# Patient Record
Sex: Male | Born: 1960
Health system: Southern US, Community
[De-identification: ages and names within clinical notes are randomized; demographics above are authoritative.]

## PROBLEM LIST (undated history)

## (undated) DIAGNOSIS — M1990 Unspecified osteoarthritis, unspecified site: Secondary | ICD-10-CM

## (undated) DIAGNOSIS — I10 Essential (primary) hypertension: Secondary | ICD-10-CM

## (undated) HISTORY — PX: OTHER SURGICAL HISTORY: SHX169

---

## 1998-07-04 ENCOUNTER — Encounter: Payer: Self-pay | Admitting: Emergency Medicine

## 1998-07-04 ENCOUNTER — Emergency Department (HOSPITAL_COMMUNITY): Admission: EM | Admit: 1998-07-04 | Discharge: 1998-07-04 | Payer: Self-pay | Admitting: Emergency Medicine

## 1998-07-11 ENCOUNTER — Emergency Department (HOSPITAL_COMMUNITY): Admission: EM | Admit: 1998-07-11 | Discharge: 1998-07-11 | Payer: Self-pay | Admitting: Emergency Medicine

## 2004-11-27 ENCOUNTER — Emergency Department (HOSPITAL_COMMUNITY): Admission: EM | Admit: 2004-11-27 | Discharge: 2004-11-27 | Payer: Self-pay | Admitting: Emergency Medicine

## 2005-03-02 ENCOUNTER — Emergency Department (HOSPITAL_COMMUNITY): Admission: EM | Admit: 2005-03-02 | Discharge: 2005-03-02 | Payer: Self-pay | Admitting: Emergency Medicine

## 2005-04-06 ENCOUNTER — Emergency Department (HOSPITAL_COMMUNITY): Admission: EM | Admit: 2005-04-06 | Discharge: 2005-04-06 | Payer: Self-pay | Admitting: Emergency Medicine

## 2007-02-22 ENCOUNTER — Ambulatory Visit (HOSPITAL_COMMUNITY): Admission: RE | Admit: 2007-02-22 | Discharge: 2007-02-22 | Payer: Self-pay | Admitting: Specialist

## 2008-02-23 ENCOUNTER — Ambulatory Visit (HOSPITAL_BASED_OUTPATIENT_CLINIC_OR_DEPARTMENT_OTHER): Admission: RE | Admit: 2008-02-23 | Discharge: 2008-02-23 | Payer: Self-pay | Admitting: Orthopedic Surgery

## 2008-07-04 ENCOUNTER — Ambulatory Visit (HOSPITAL_BASED_OUTPATIENT_CLINIC_OR_DEPARTMENT_OTHER): Admission: RE | Admit: 2008-07-04 | Discharge: 2008-07-04 | Payer: Self-pay | Admitting: Orthopedic Surgery

## 2008-10-06 ENCOUNTER — Emergency Department (HOSPITAL_COMMUNITY): Admission: EM | Admit: 2008-10-06 | Discharge: 2008-10-06 | Payer: Self-pay | Admitting: Emergency Medicine

## 2009-04-13 IMAGING — CR DG LUMBAR SPINE COMPLETE 4+V
5 series · 5 of 5 positions shown · non-contrast
Comparison: None.

CLINICAL DATA: 47-year-old male back pain, recent injury 5 days ago

LUMBAR SPINE - COMPLETE 4+ VIEW

[t l-spine a.p.]
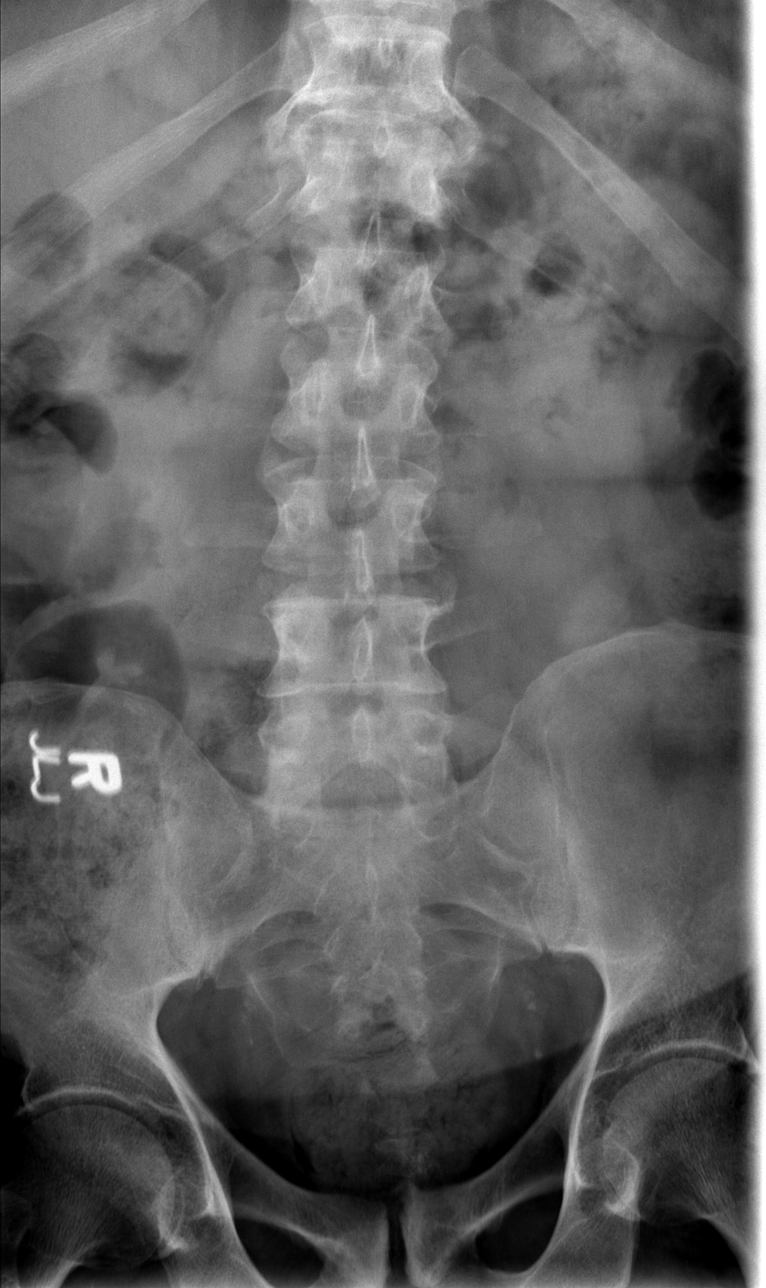

[t l-spine oblique exposure (1 of 2)]
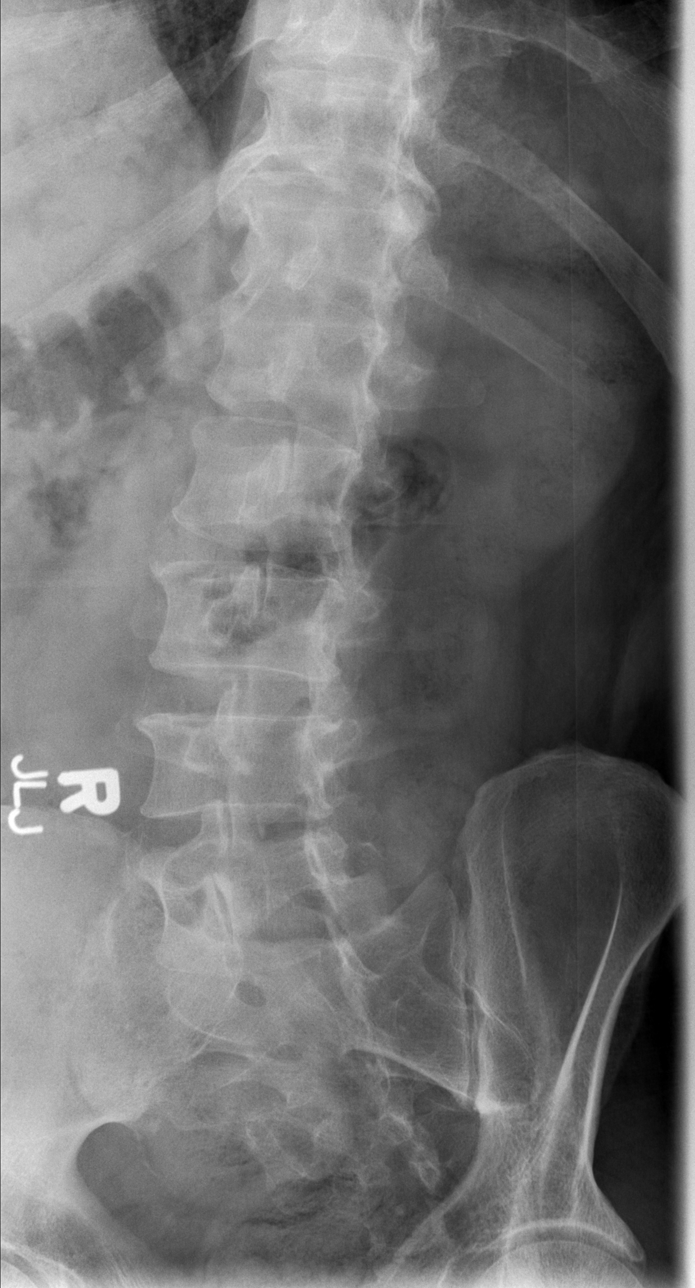

[t l-spine oblique exposure (2 of 2)]
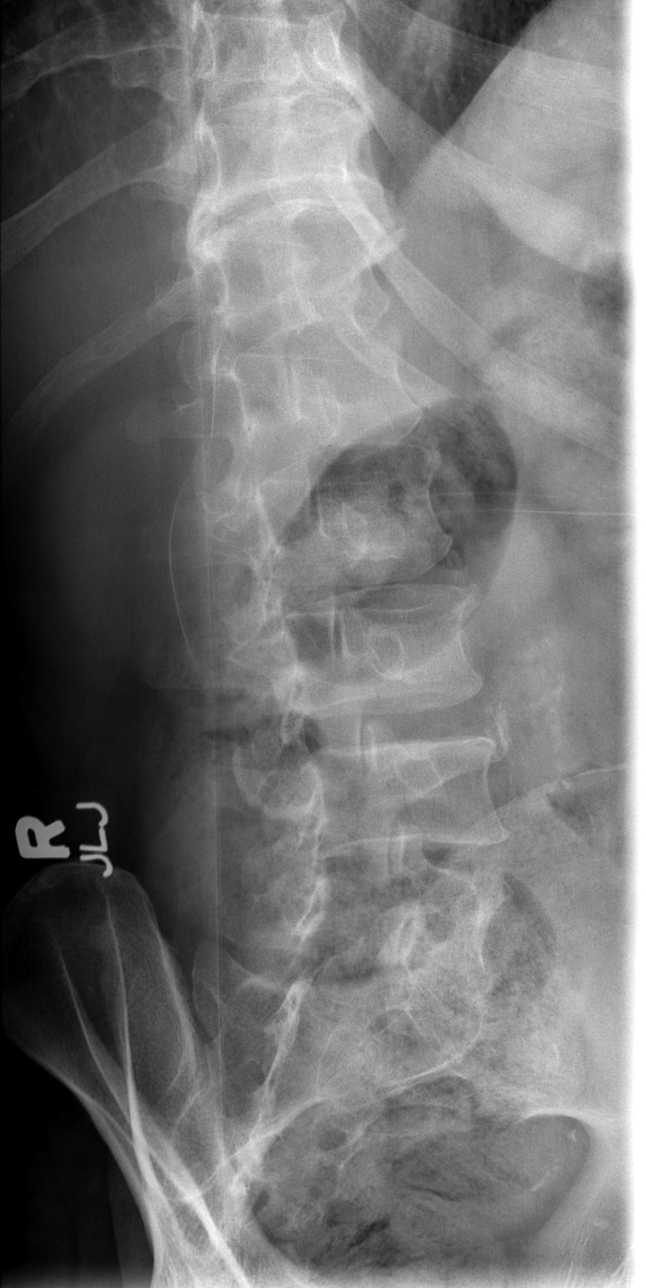

[t l-spine lat]
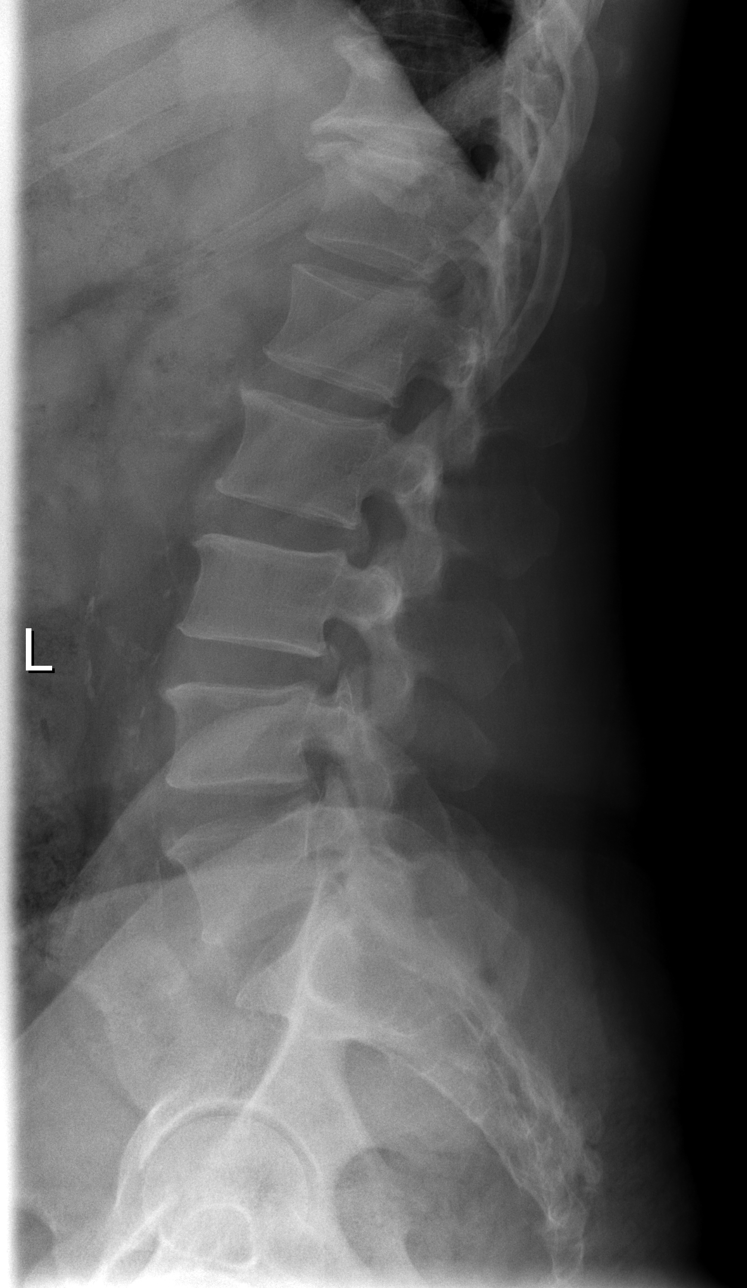

[t l-spine l5-s1 spot]
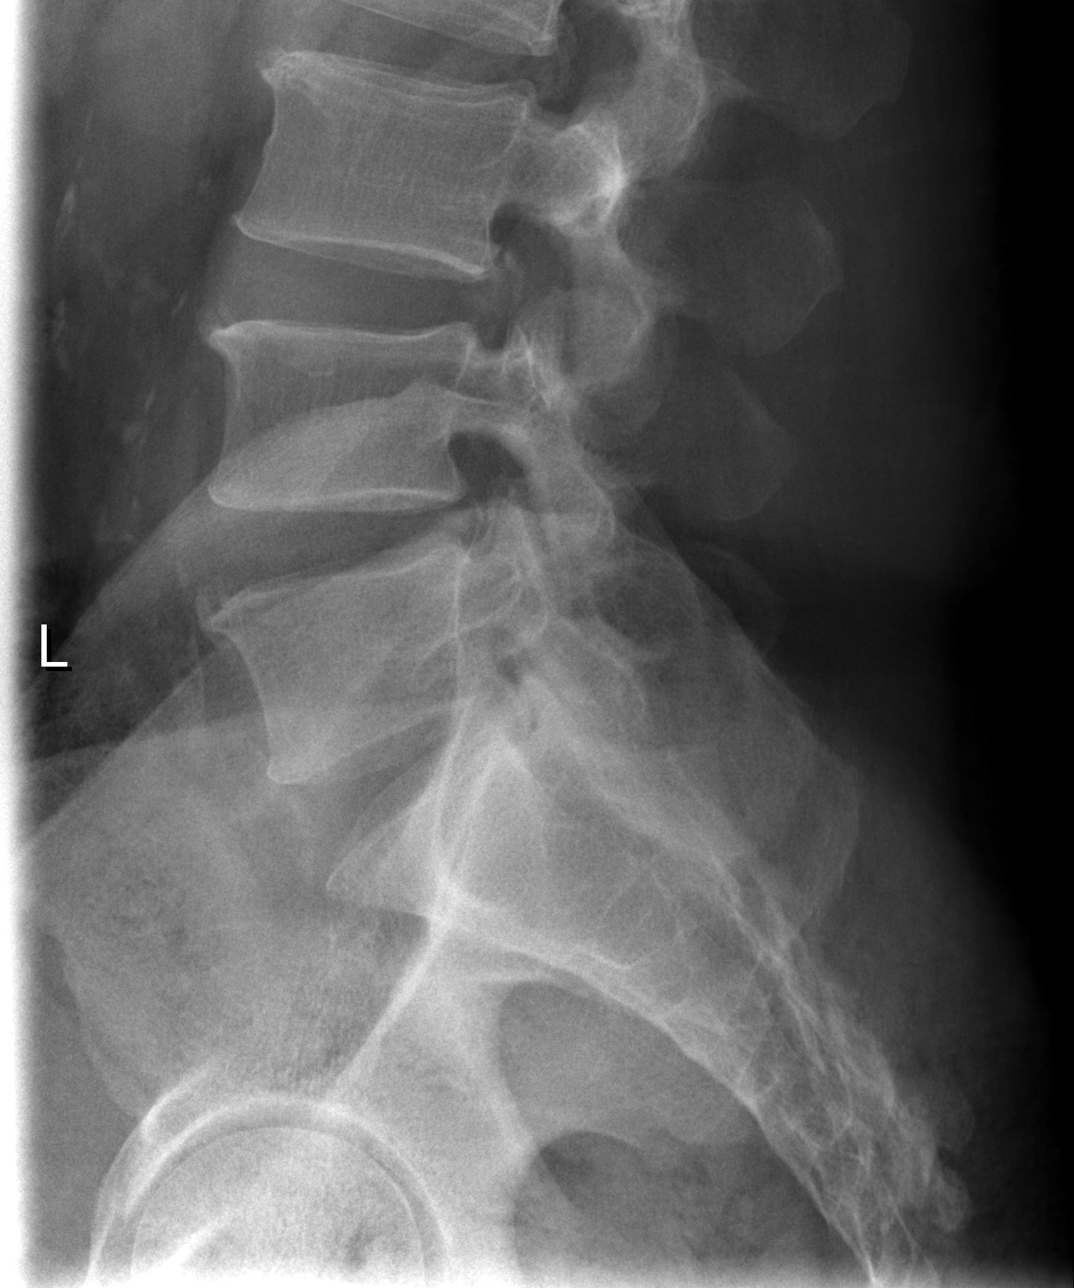

[5 of 5 positions shown; findings below may reference images not displayed]

FINDINGS: Normal lumbar spine alignment.  Minimal endplate bony
spurring of the lumbar spine anteriorly.  More advanced
degenerative disc disease and spondylosis of the lower thoracic
spine at T11-12.  No compression fracture, focal kyphosis, or wedge-
shaped deformity.  Atherosclerosis of the aorta anteriorly.  No
pars defects.  Intact pedicles.  Normal SI joints.
IMPRESSION: Mild degenerative changes and spondylosis.  No acute finding by
plain radiography.

## 2010-11-12 ENCOUNTER — Encounter (INDEPENDENT_AMBULATORY_CARE_PROVIDER_SITE_OTHER): Payer: Self-pay | Admitting: *Deleted

## 2010-11-13 ENCOUNTER — Encounter (INDEPENDENT_AMBULATORY_CARE_PROVIDER_SITE_OTHER): Payer: Self-pay | Admitting: *Deleted

## 2010-11-14 ENCOUNTER — Ambulatory Visit
Admission: RE | Admit: 2010-11-14 | Discharge: 2010-11-14 | Payer: Self-pay | Source: Home / Self Care | Attending: Gastroenterology | Admitting: Gastroenterology

## 2010-11-27 ENCOUNTER — Ambulatory Visit
Admission: RE | Admit: 2010-11-27 | Discharge: 2010-11-27 | Payer: Self-pay | Source: Home / Self Care | Attending: Gastroenterology | Admitting: Gastroenterology

## 2010-11-27 ENCOUNTER — Encounter: Payer: Self-pay | Admitting: Gastroenterology

## 2010-11-27 ENCOUNTER — Other Ambulatory Visit: Payer: Self-pay | Admitting: Gastroenterology

## 2010-12-03 ENCOUNTER — Encounter: Payer: Self-pay | Admitting: Gastroenterology

## 2010-12-11 ENCOUNTER — Emergency Department (HOSPITAL_COMMUNITY): Payer: Worker's Compensation

## 2010-12-11 ENCOUNTER — Inpatient Hospital Stay (HOSPITAL_COMMUNITY)
Admission: EM | Admit: 2010-12-11 | Discharge: 2010-12-13 | DRG: 494 | Disposition: A | Discharge status: Home / Self Care | Payer: Worker's Compensation | Attending: Orthopaedic Surgery | Admitting: Orthopaedic Surgery

## 2010-12-11 ENCOUNTER — Emergency Department (HOSPITAL_COMMUNITY): Payer: Self-pay

## 2010-12-11 ENCOUNTER — Encounter (HOSPITAL_COMMUNITY): Payer: Self-pay | Admitting: Radiology

## 2010-12-11 DIAGNOSIS — W11XXXA Fall on and from ladder, initial encounter: Secondary | ICD-10-CM | POA: Diagnosis present

## 2010-12-11 DIAGNOSIS — S42413A Displaced simple supracondylar fracture without intercondylar fracture of unspecified humerus, initial encounter for closed fracture: Principal | ICD-10-CM | POA: Diagnosis present

## 2010-12-11 DIAGNOSIS — Y998 Other external cause status: Secondary | ICD-10-CM

## 2010-12-11 DIAGNOSIS — F17200 Nicotine dependence, unspecified, uncomplicated: Secondary | ICD-10-CM | POA: Diagnosis present

## 2010-12-11 LAB — GLUCOSE, CAPILLARY: Glucose-Capillary: 225 mg/dL — ABNORMAL HIGH (ref 70–99)

## 2010-12-12 ENCOUNTER — Inpatient Hospital Stay (HOSPITAL_COMMUNITY): Payer: Worker's Compensation

## 2010-12-12 LAB — GLUCOSE, CAPILLARY

## 2010-12-12 NOTE — Letter (Signed)
Summary: Pre Visit Letter Revised  [facility]  [address] [place], [place] [address]   Phone: [phone]  Fax: [phone]        11/12/2010 MRN: [id number] [facility] [address] [place], [place]  [address]             Procedure Date:  November 27, 2010   Welcome to the Gastroenterology Division at [facility].    You are scheduled to see a nurse for your pre-procedure visit on November 14, 2010 at 4:30pm on the 3rd floor at [facility], [address]  We ask that you try to arrive at our office 15 minutes prior to your appointment time to allow for check-in.  Please take a minute to review the attached form.  If you answer "Yes" to one or more of the questions on the first page, we ask that you call the person listed at your earliest opportunity.  If you answer "No" to all of the questions, please complete the rest of the form and bring it to your appointment.    Your nurse visit will consist of discussing your medical and surgical history, your immediate family medical history, and your medications.   If you are unable to list all of your medications on the form, please bring the medication bottles to your appointment and we will list them.  We will need to be aware of both prescribed and over the counter drugs.  We will need to know exact dosage information as well.    Please be prepared to read and sign documents such as consent forms, a financial agreement, and acknowledgement forms.  If necessary, and with your consent, a friend or relative is welcome to sit-in on the nurse visit with you.  Please bring your insurance card so that we may make a copy of it.  If your insurance requires a referral to see a specialist, please bring your referral form from your primary care physician.  No co-pay is required for this nurse visit.     If you cannot keep your appointment, please call [phone] to cancel or reschedule prior to your  appointment date.  This allows [place] the opportunity to schedule an appointment for another patient in need of care.    Thank you for choosing [facility] for your medical needs.  We appreciate the opportunity to care for you.  Please visit [place] at our website  to learn more about our practice.  Sincerely, The Gastroenterology Division

## 2010-12-12 NOTE — Procedures (Addendum)
Summary: Colonoscopy  Patient: Travis White Note: All result statuses are Final unless otherwise noted.  Tests: (1) Colonoscopy (COL)   COL Colonoscopy           DONE     [facility]     [address]     [place], [place]  [address]           COLONOSCOPY PROCEDURE REPORT           PATIENT:  [name], [name]  MR#:  [id number]     BIRTHDATE:  1961/03/01, 49 yrs. old  GENDER:  male     ENDOSCOPIST:  Vania Rea. Jarold Motto, MD, [facility]     REF. BY:  Jarome Matin, M.D.     PROCEDURE DATE:  11/27/2010     PROCEDURE:  Colonoscopy with snare polypectomy     ASA CLASS:  Class II     INDICATIONS:  Routine Risk Screening     MEDICATIONS:   Fentanyl 75 mcg IV, Versed 7 mg           DESCRIPTION OF PROCEDURE:   After the risks benefits and     alternatives of the procedure were thoroughly explained, informed     consent was obtained.  Digital rectal exam was performed and     revealed no abnormalities.   The [id number] endoscope was     introduced through the anus and advanced to the cecum, which was     identified by both the appendix and ileocecal valve, without     limitations.  The quality of the prep was excellent, using     MoviPrep.  The instrument was then slowly withdrawn as the colon     was fully examined.     <<PROCEDUREIMAGES>>           FINDINGS:  ULTRASONIC FINDINGS:  A sessile polyp was found at the     hepatic flexure. -3 CM. FLAT SESSILE FLESHY POLYP PIECEMEAL     REMOVED.  There were multiple polyps identified and removed. in     the rectum. FLAT 1-2 MM POLYPS COLD SNARE REMOVED.   Retroflexed     views in the rectum revealed external hemorrhoids.    The scope     was then withdrawn from the patient and the procedure completed.           COMPLICATIONS:  None     ENDOSCOPIC IMPRESSION:     1) Sessile polyp at the hepatic flexure     2) Polyps, multiple in the rectum     3) External hemorrhoids     MULTIPLE ADENOMAS.R/O CA.     RECOMMENDATIONS:     1) Repeat  Colonoscopy in 3 years.     REPEAT EXAM:  No           ______________________________     Vania Rea. Jarold Motto, MD, [facility]           CC:  Jarome Matin, M.D.           n.     eSIGNED:   Vania Rea. Patterson at 11/27/2010 11:55 AM           Beaulah Dinning, [id number]  Note: An exclamation mark (!) indicates a result that was not dispersed into the flowsheet. Document Creation Date: 11/27/2010 11:55 AM _______________________________________________________________________  (1) Order result status: Final Collection or observation date-time: 11/27/2010 11:46 Requested date-time:  Receipt date-time:  Reported date-time:  Referring Physician:   Ordering Physician: Onalee Hua  Jarold Motto [phone]) Specimen Source:  Source: Launa Grill Order Number: [id number] Lab site:   Appended Document: Colonoscopy     Procedures Next Due Date:    Colonoscopy: 11/2013

## 2010-12-12 NOTE — Letter (Signed)
Summary: Patient Notice- Polyp Results  [facility]  [address] [place], [place] [address]   Phone: [phone]  Fax: [phone]        December 03, 2010 MRN: [id number]    Travis White [address] [place], [place]  [address]    Dear [name],  I am pleased to inform you that the colon polyp(s) removed during your recent colonoscopy was (were) found to be benign (no cancer detected) upon pathologic examination.  I recommend you have a repeat colonoscopy examination in 3_ years to look for recurrent polyps, as having colon polyps increases your risk for having recurrent polyps or even colon cancer in the future.  Should you develop new or worsening symptoms of abdominal pain, bowel habit changes or bleeding from the rectum or bowels, please schedule an evaluation with either your primary care physician or with me.  Additional information/recommendations:  _x_ No further action with gastroenterology is needed at this time. Please      follow-up with your primary care physician for your other healthcare      needs.  __ Please call [phone] to schedule a return visit to review your      situation.  __ Please keep your follow-up visit as already scheduled.  __ Continue treatment plan as outlined the day of your exam.  Please call us if you are having persistent problems or have questions about your condition that have not been fully answered at this time.  Sincerely,  Mardella Layman MD [facility]  This letter has been electronically signed by your physician.  Appended Document: Patient Notice- Polyp Results Letter mailed

## 2010-12-12 NOTE — Letter (Signed)
Summary: [facility] Instructions  [facility]  [address] [place], [place] [address]   Phone: [phone]  Fax: [phone]       HABEEB PUERTAS    January 11, 1961    MRN: [id number]        Procedure Day Dorna Bloom:  Wednesday 11/27/2010     Arrival Time:  9:30 am     Procedure Time: 10:30 am     Location of Procedure:                    _ x_  [facility] (4th Floor)                        PREPARATION FOR COLONOSCOPY WITH MOVIPREP   Starting 5 days prior to your procedure Friday 1/13 do not eat nuts, seeds, popcorn, corn, beans, peas,  salads, or any raw vegetables.  Do not take any fiber supplements (e.g. Metamucil, Citrucel, and Benefiber).  THE DAY BEFORE YOUR PROCEDURE         DATE: Tuesday 1/17  1.  Drink clear liquids the entire day-NO SOLID FOOD  2.  Do not drink anything colored red or purple.  Avoid juices with pulp.  No orange juice.  3.  Drink at least 64 oz. (8 glasses) of fluid/clear liquids during the day to prevent dehydration and help the prep work efficiently.  CLEAR LIQUIDS INCLUDE: Water Jello Ice Popsicles Tea (sugar ok, no milk/cream) Powdered fruit flavored drinks Coffee (sugar ok, no milk/cream) Gatorade Juice: apple, white grape, white cranberry  Lemonade Clear bullion, consomm, broth Carbonated beverages (any kind) Strained chicken noodle soup Hard Candy                             4.  In the morning, mix first dose of MoviPrep solution:    Empty 1 Pouch A and 1 Pouch B into the disposable container    Add lukewarm drinking water to the top line of the container. Mix to dissolve    Refrigerate (mixed solution should be used within 24 hrs)  5.  Begin drinking the prep at 5:00 p.m. The MoviPrep container is divided by 4 marks.   Every 15 minutes drink the solution down to the next mark (approximately 8 oz) until the full liter is complete.   6.  Follow completed prep with 16 oz of clear liquid of your choice  (Nothing red or purple).  Continue to drink clear liquids until bedtime.  7.  Before going to bed, mix second dose of MoviPrep solution:    Empty 1 Pouch A and 1 Pouch B into the disposable container    Add lukewarm drinking water to the top line of the container. Mix to dissolve    Refrigerate  THE DAY OF YOUR PROCEDURE      DATE: Wednesday 1/18  Beginning at 5:30 a.m. (5 hours before procedure):         1. Every 15 minutes, drink the solution down to the next mark (approx 8 oz) until the full liter is complete.  2. Follow completed prep with 16 oz. of clear liquid of your choice.    3. You may drink clear liquids until 8:30 am (2 HOURS BEFORE PROCEDURE).   MEDICATION INSTRUCTIONS  Unless otherwise instructed, you should take regular prescription medications with a small sip of water   as early as possible the morning of  your procedure.        OTHER INSTRUCTIONS  You will need a responsible adult at least 51 years of age to accompany you and drive you home.   This person must remain in the waiting room during your procedure.  Wear loose fitting clothing that is easily removed.  Leave jewelry and other valuables at home.  However, you may wish to bring a book to read or  an iPod/MP3 player to listen to music as you wait for your procedure to start.  Remove all body piercing jewelry and leave at home.  Total time from sign-in until discharge is approximately 2-3 hours.  You should go home directly after your procedure and rest.  You can resume normal activities the  day after your procedure.  The day of your procedure you should not:   Drive   Make legal decisions   Operate machinery   Drink alcohol   Return to work  You will receive specific instructions about eating, activities and medications before you leave.    The above instructions have been reviewed and explained to me by   Karl Bales RN  November 14, 2010 4:37 PM    I fully understand  and can verbalize these instructions _____________________________ Date _________

## 2010-12-12 NOTE — Miscellaneous (Signed)
Summary: Lidocaine 3% - Hydrocortisone 1% Cream Kit  Clinical Lists Changes  Medications: Removed medication of MOVIPREP 100 GM  SOLR (PEG-KCL-NACL-NASULF-NA ASC-C) As per prep instructions. Added new medication of LIDOCAINE-HYDROCORTISONE ACE 3-1 % KIT (LIDOCAINE-HYDROCORTISONE ACE) apply to rectum two times a day - Signed Rx of LIDOCAINE-HYDROCORTISONE ACE 3-1 % KIT (LIDOCAINE-HYDROCORTISONE ACE) apply to rectum two times a day;  #1 tube x 1;  Signed;  Entered by: Harlow Mares CMA (AAMA);  Authorized by: Mardella Layman MD [facility];  Method used: Electronically to [facility] [phone]*, [address] [place], [place]  [address], Ph: [phone], Fax: [phone]    Prescriptions: LIDOCAINE-HYDROCORTISONE ACE 3-1 % KIT (LIDOCAINE-HYDROCORTISONE ACE) apply to rectum two times a day  #1 tube x 1   Entered by:   Harlow Mares CMA (AAMA)   Authorized by:   Mardella Layman MD [facility]   Signed by:   Harlow Mares CMA (AAMA) on 11/27/2010   Method used:   Electronically to        [facility] [phone]* (retail)       [address]       [place], [place]  [address]       Ph: [phone]       Fax: [phone]   RxID:   [id number]

## 2010-12-12 NOTE — Miscellaneous (Signed)
Summary: [facility] previsit  Clinical Lists Changes  Medications: Added new medication of MOVIPREP 100 GM  SOLR (PEG-KCL-NACL-NASULF-NA ASC-C) As per prep instructions. - Signed Rx of MOVIPREP 100 GM  SOLR (PEG-KCL-NACL-NASULF-NA ASC-C) As per prep instructions.;  #1 x 0;  Signed;  Entered by: Karl Bales RN;  Authorized by: Mardella Layman MD [facility];  Method used: Electronically to [facility] [phone]*, [address] [place], [place]  [address], Ph: [phone], Fax: [phone] Observations: Added new observation of NKA: T (11/14/2010 16:19)    Prescriptions: MOVIPREP 100 GM  SOLR (PEG-KCL-NACL-NASULF-NA ASC-C) As per prep instructions.  #1 x 0   Entered by:   Karl Bales RN   Authorized by:   Mardella Layman MD [facility]   Signed by:   Karl Bales RN on 11/14/2010   Method used:   Electronically to        [facility] [phone]* (retail)       [address]       [place], [place]  [address]       Ph: [phone]       Fax: [phone]   RxID:   [id number]

## 2010-12-13 LAB — HEMOGLOBIN AND HEMATOCRIT, BLOOD: HCT: 33.2 % — ABNORMAL LOW (ref 39.0–52.0)

## 2010-12-13 LAB — GLUCOSE, CAPILLARY: Glucose-Capillary: 166 mg/dL — ABNORMAL HIGH (ref 70–99)

## 2010-12-24 NOTE — Op Note (Signed)
NAME:  Travis White, Travis White               ACCOUNT NO.:  [id number]  MEDICAL RECORD NO.:  [id number]           PATIENT TYPE:  I  LOCATION:  5012                         FACILITY:  [facility]  PHYSICIAN:  Kou Gucciardo C. Ophelia White, M.D.    DATE OF BIRTH:  01/28/1961  DATE OF PROCEDURE:  12/11/2010 DATE OF DISCHARGE:                              OPERATIVE REPORT   PREOPERATIVE DIAGNOSIS:  Left comminuted supracondylar fracture with intercondylar extension.  POSTOPERATIVE DIAGNOSIS:  Left comminuted supracondylar fracture with intercondylar extension.  PROCEDURE:  ORIF left supracondylar humerus fracture with intercondylar extension, olecranon osteotomy.  SURGEON:  Annell Greening, MD  ANESTHESIA:  General.  TOURNIQUET TIME:  An hour and 35 minutes.  COMPONENTS:  DePuy posterolateral plate and medial plate.  Cannulated screw and 18-gauge wire for olecranon osteotomy fixation.  SURGEON:  Annell Greening, MD  ASSISTANTRanae Palms, RNFA  ANESTHESIA:  GOT plus preoperative interscalene block.  BRIEF HISTORY:  This 50 year old male was on the job on the ladder.  The ladder gave way or collapsed and he fell with the ladder lying on the floor on outstretched left nondominant arm with the supracondylar humerus fracture, intercondylar extension with comminution in the intercondylar region.  He was neurovascularly intact and taken to the OR for emergent fixation.  He had been n.p.o. since early a.m.  After induction of general anesthesia orotracheal intubation, the patient was placed prone on chest rolls with careful padding and positioning, foam pads over the arm, 10/15 drape.  Prepping and draping from the wrist to the shoulder was performed, impervious stockinette, Coban and extremity sheets and drapes, sterile tourniquet, sterile skin marker, Betadine Steri-Strips.  Surgical time-out procedure was completed.  Arm was wrapped in Esmarch, tourniquet inflated, 2 grams Ancef began prophylactically.  Time-out was  completed.  Posterior incision was made.  Soft tissue was developed on the triceps, right and left.  A cannulated pin was placed up the olecranon followed by over drilling and then a 6.2 K-wire placed in the ulna appropriate position just slightly and then oscillating saw was used to perform the osteotomy in the mid position of olecranon at oblique angle and cracking it with a 1-inch osteotome.  It was distracted.  There was difficulty finding the ulnar nerve.  About 20 minutes were spent trying to find the ulnar nerve but it was not in the groove and the 2 distal fragments of the humerus were widespread by several centimeters with rotation.  Continued dissection developing the olecranon osteotomy, lifting the triceps, and eventually the ulnar nerve was finally found proximally closer to the inner muscular septum falling back distally and vessel loop was placed for preservation of infection.  Posterior lateral plate was placed first after initial K-wire fixation with cross pins, transverse distal pin. Combination of locking, nonlocking screws were used for stabilization, some of it were slightly angled, were nonlocking used with washer.  This was the DePuy posterolateral anatomic humerus plate, titanium.  Medial plate was used on the medial side for reduction.  There were some comminution in the fossa with some small fragments and a fingertip could be placed through the middle  of the fossa anteriorly.  This actually allowed for visualization and then making sure that the home run screws are in good position and did not come anteriorly because fingertip could be introduced anteriorly through the awl for palpation.  Proximal screws were placed bicortically.  Some of the screws on medial plate ran into the screws from the posterior lateral plate and had to be angled with washers to make sure there were bicortical tightened down securely.  Several screws were passed from the medial lateral  and lateral medial and home run screw was placed from the posterior lateral side arm piece across which was 56-60-mm.  Spot pictures were taken confirming excellent position alignment of all screws, good correction of the anterior angulation.  I was able to flex and extend with no crepitus.  Olecranon osteotomy was repaired, washer was placed on the screw, small drill made in figure-of-eight wire that was passed, tightened down over the handle of a Cobb with square knot tight and cut, tips turned down, irrigation again.  Ulnar nerve was in good position, was not transposed.  Triceps was repaired on each side.  Care was taken not to damage the ulnar nerve with the sutures.  Two on the subcutaneous tissue and skin staple closure postop dressing and long-arm splint. Instrument and needle count was correct.  Time-out closure procedure was completed.  There was no specimen.  Few of the tiny 2-3 mm pieces of bone were discarded from the fossa.     Travis White, M.D.     MCY/MEDQ  D:  12/11/2010  T:  12/12/2010  Job:  [id number]  Electronically Signed by Annell Greening M.D. on 12/24/2010 04:58:03 PM

## 2011-01-02 ENCOUNTER — Ambulatory Visit: Payer: Worker's Compensation | Attending: Orthopaedic Surgery | Admitting: Rehabilitation

## 2011-01-02 DIAGNOSIS — M2550 Pain in unspecified joint: Secondary | ICD-10-CM | POA: Insufficient documentation

## 2011-01-02 DIAGNOSIS — M2560 Stiffness of unspecified joint, not elsewhere classified: Secondary | ICD-10-CM | POA: Insufficient documentation

## 2011-01-02 DIAGNOSIS — IMO0001 Reserved for inherently not codable concepts without codable children: Secondary | ICD-10-CM | POA: Insufficient documentation

## 2011-01-06 ENCOUNTER — Ambulatory Visit: Payer: Worker's Compensation | Admitting: Physical Therapy

## 2011-01-07 ENCOUNTER — Ambulatory Visit: Payer: Worker's Compensation | Admitting: Physical Therapy

## 2011-01-09 ENCOUNTER — Ambulatory Visit: Payer: Worker's Compensation | Attending: Orthopaedic Surgery | Admitting: Rehabilitation

## 2011-01-09 DIAGNOSIS — M2550 Pain in unspecified joint: Secondary | ICD-10-CM | POA: Insufficient documentation

## 2011-01-09 DIAGNOSIS — M2560 Stiffness of unspecified joint, not elsewhere classified: Secondary | ICD-10-CM | POA: Insufficient documentation

## 2011-01-09 DIAGNOSIS — IMO0001 Reserved for inherently not codable concepts without codable children: Secondary | ICD-10-CM | POA: Insufficient documentation

## 2011-01-14 ENCOUNTER — Ambulatory Visit: Payer: Worker's Compensation | Admitting: Rehabilitation

## 2011-01-15 ENCOUNTER — Ambulatory Visit: Payer: Worker's Compensation | Admitting: Rehabilitation

## 2011-01-16 ENCOUNTER — Ambulatory Visit: Payer: Worker's Compensation | Admitting: Physical Therapy

## 2011-01-20 ENCOUNTER — Ambulatory Visit: Payer: Worker's Compensation | Admitting: Physical Therapy

## 2011-01-22 ENCOUNTER — Ambulatory Visit: Payer: Worker's Compensation | Admitting: Physical Therapy

## 2011-01-23 ENCOUNTER — Ambulatory Visit: Payer: Worker's Compensation | Admitting: Physical Therapy

## 2011-01-27 ENCOUNTER — Ambulatory Visit: Payer: Worker's Compensation | Admitting: Physical Therapy

## 2011-01-29 ENCOUNTER — Ambulatory Visit: Payer: Worker's Compensation | Admitting: Physical Therapy

## 2011-01-30 ENCOUNTER — Ambulatory Visit: Payer: Worker's Compensation | Admitting: Physical Therapy

## 2011-02-03 ENCOUNTER — Ambulatory Visit: Payer: Worker's Compensation | Admitting: Physical Therapy

## 2011-02-05 ENCOUNTER — Ambulatory Visit: Payer: Worker's Compensation | Admitting: Physical Therapy

## 2011-02-06 ENCOUNTER — Ambulatory Visit: Payer: Worker's Compensation | Admitting: Rehabilitation

## 2011-02-10 ENCOUNTER — Ambulatory Visit: Payer: Worker's Compensation | Attending: Orthopaedic Surgery | Admitting: Physical Therapy

## 2011-02-10 DIAGNOSIS — IMO0001 Reserved for inherently not codable concepts without codable children: Secondary | ICD-10-CM | POA: Insufficient documentation

## 2011-02-10 DIAGNOSIS — M2550 Pain in unspecified joint: Secondary | ICD-10-CM | POA: Insufficient documentation

## 2011-02-10 DIAGNOSIS — M2560 Stiffness of unspecified joint, not elsewhere classified: Secondary | ICD-10-CM | POA: Insufficient documentation

## 2011-02-12 ENCOUNTER — Ambulatory Visit: Payer: Worker's Compensation | Admitting: Physical Therapy

## 2011-02-13 ENCOUNTER — Ambulatory Visit: Payer: Worker's Compensation | Admitting: Rehabilitation

## 2011-02-18 ENCOUNTER — Ambulatory Visit: Payer: Worker's Compensation | Admitting: Rehabilitation

## 2011-02-19 ENCOUNTER — Ambulatory Visit: Payer: Worker's Compensation | Admitting: Physical Therapy

## 2011-02-20 ENCOUNTER — Ambulatory Visit: Payer: Worker's Compensation | Admitting: Rehabilitation

## 2011-02-25 ENCOUNTER — Ambulatory Visit: Payer: Worker's Compensation | Admitting: Physical Therapy

## 2011-02-26 ENCOUNTER — Ambulatory Visit: Payer: Worker's Compensation | Admitting: Rehabilitation

## 2011-02-27 ENCOUNTER — Ambulatory Visit: Payer: Worker's Compensation | Admitting: Physical Therapy

## 2011-03-04 ENCOUNTER — Ambulatory Visit: Payer: Worker's Compensation | Admitting: Physical Therapy

## 2011-03-05 ENCOUNTER — Ambulatory Visit: Payer: Worker's Compensation | Admitting: Physical Therapy

## 2011-03-06 ENCOUNTER — Ambulatory Visit: Payer: Worker's Compensation | Admitting: Physical Therapy

## 2011-03-10 ENCOUNTER — Ambulatory Visit: Payer: Worker's Compensation | Admitting: Physical Therapy

## 2011-03-12 ENCOUNTER — Ambulatory Visit: Payer: Worker's Compensation | Attending: Rehabilitation | Admitting: Rehabilitation

## 2011-03-12 DIAGNOSIS — M2550 Pain in unspecified joint: Secondary | ICD-10-CM | POA: Insufficient documentation

## 2011-03-12 DIAGNOSIS — IMO0001 Reserved for inherently not codable concepts without codable children: Secondary | ICD-10-CM | POA: Insufficient documentation

## 2011-03-12 DIAGNOSIS — M2560 Stiffness of unspecified joint, not elsewhere classified: Secondary | ICD-10-CM | POA: Insufficient documentation

## 2011-03-13 ENCOUNTER — Ambulatory Visit: Payer: Worker's Compensation | Admitting: Rehabilitation

## 2011-03-17 ENCOUNTER — Ambulatory Visit: Payer: Worker's Compensation | Admitting: Physical Therapy

## 2011-03-19 ENCOUNTER — Ambulatory Visit: Payer: Worker's Compensation | Admitting: Physical Therapy

## 2011-03-20 ENCOUNTER — Ambulatory Visit: Payer: Worker's Compensation | Admitting: Physical Therapy

## 2011-03-25 NOTE — Op Note (Signed)
NAME:  Travis White, Travis White               ACCOUNT NO.:  [id number]   MEDICAL RECORD NO.:  [id number]          PATIENT TYPE:  AMB   LOCATION:  DSC                          FACILITY:  [facility]   PHYSICIAN:  Cindee Salt, M.D.       DATE OF BIRTH:  26-Jan-1961   DATE OF PROCEDURE:  02/23/2008  DATE OF DISCHARGE:                               OPERATIVE REPORT   PREOPERATIVE DIAGNOSIS:  Carpal tunnel syndrome, left hand.   POSTOPERATIVE DIAGNOSIS:  Carpal tunnel syndrome, left hand.   OPERATION:  Decompression of left median nerve.   SURGEON:  Cindee Salt, M.D.   ASSISTANT:  __________   ANESTHESIA:  Forearm based IV regional.   ANESTHESIOLOGIST:  Burna Forts, M.D.   HISTORY:  The patient is a 50 year old male with a history of carpal  tunnel syndrome, EMG nerve conductions positive, not responsive to  conservative treatment.  He has elected to undergo decompression.  Pre,  peri, and postoperative course are known to him.  He is aware of risks  and complications including infection, recurrence, injury to arteries,  nerves, and tendons, incomplete relief of symptoms, and dystrophy.  He  has elect to proceed to have procedure done. Questions have been  encouraged and answered in the preoperative area.  The patient was seen,  the extremity marked by both the patient and surgeon.  Antibiotic given.   DESCRIPTION OF PROCEDURE:  The patient was brought to the operating room  where forearm based IV regional anesthetic was carried out under the  direction of Dr. Jacklynn Bue.  He was prepped using DuraPrep, supine  position, left arm free.  After a time-out was performed, a longitudinal  incision was made in the palm and carried down through subcutaneous  tissue.  Bleeders were electrocauterized.  Palmar fascia was split,  superficial palmar arch identified, and flexor tendon to the ring little  finger identified to the ulnar side of median nerve.  The carpal  retinaculum was incised with sharp  dissection.  Right angle and Sewall  retractor were placed between skin and forearm fascia.  The fascia was  released for approximately 1.5 cm proximal to the wrist crease under  direct vision.  The canal was explored.  Area compression to the nerve  was immediately apparent with an hourglass deformity and significant  hyperemia.  The wound was irrigated.  Skin closed with interrupted 5-0  Vicryl Rapide sutures.  Sterile compressive dressing and splint to the  wrist was applied with finger left free.  The patient tolerated the  procedure well and was taken to the recovery room for observation in  satisfactory condition.  He will be discharged to home __________  [facility] in 1 week on Vicodin.          ______________________________  Cindee Salt, M.D.    GK/MEDQ  D:  02/23/2008  T:  02/24/2008  Job:  [id number]

## 2011-03-25 NOTE — Op Note (Signed)
NAME:  Travis White, Travis White               ACCOUNT NO.:  [id number]   MEDICAL RECORD NO.:  [id number]          PATIENT TYPE:  AMB   LOCATION:  DSC                          FACILITY:  [facility]   PHYSICIAN:  Cindee Salt, M.D.       DATE OF BIRTH:  July 27, 1961   DATE OF PROCEDURE:  07/04/2008  DATE OF DISCHARGE:                               OPERATIVE REPORT   PREOPERATIVE DIAGNOSIS:  Carpal tunnel syndrome, right hand.   POSTOPERATIVE DIAGNOSIS:  Carpal tunnel syndrome, right hand.   OPERATION:  Decompression right median nerve.   SURGEON:  Cindee Salt, MD   ASSISTANT:  Joaquin Courts, RN   ANESTHESIA:  Forearm based IV regional.   ANESTHESIOLOGIST:  Janetta Hora. Gelene Mink, MD   HISTORY:  The patient is a 50 year old male with a history of carpal  tunnel syndrome.  EMG nerve conductions positive which has not responded  to conservative treatment.  He has elected to proceed to have this  surgically released.  He is aware of risks and complications including  infection, recurrence injury to arteries, nerves, or tendons, incomplete  relief of symptoms, and dystrophy.  Preoperative area, the patient is  seen.  The extremity marked by both the patient and surgeon.  Antibiotic  given.   PROCEDURE:  The patient was brought to the operating room where forearm  based IV regional anesthetic was carried out without difficulty.  He was  prepped using DuraPrep, supine position, right arm free.  A time-out was  taken.  A longitudinal incision was made in the palm carried down  through the subcutaneous tissue.  Bleeders were electrocauterized.  Palmar fascia was split.  Superficial palmar arch identified.  The  flexor tendon to the ring and little finger identified.  To the ulnar  side of the median nerve, the carpal retinaculum was incised with a  sharp dissection.  Right angle and Sewall retractor were placed between  skin and forearm fascia.  The fascia was released for approximately 1.5  cm proximal to the  wrist crease under direct vision.  Canal was  explored.  Area of compression to the nerve was apparent.  No further  lesions were identified.  The wound was irrigated.  Skin was then closed  with interrupted 5-0 Vicryl Rapide sutures.  A sterile compressive  dressing splint to the wrist with fingers free was applied.  The patient  tolerated the procedure well and was taken to the recovery room for  observation in satisfactory condition.  He will be discharged to home to  return to [facility] of [place] in 1 week on Vicodin.           ______________________________  Cindee Salt, M.D.    GK/MEDQ  D:  07/04/2008  T:  07/05/2008  Job:  [id number]

## 2011-08-05 LAB — POCT HEMOGLOBIN-HEMACUE: Hemoglobin: 15.2

## 2011-11-14 ENCOUNTER — Other Ambulatory Visit: Payer: Self-pay | Admitting: Internal Medicine

## 2011-11-14 DIAGNOSIS — M542 Cervicalgia: Secondary | ICD-10-CM

## 2011-11-17 ENCOUNTER — Other Ambulatory Visit: Payer: Self-pay | Admitting: Internal Medicine

## 2011-11-17 DIAGNOSIS — Z139 Encounter for screening, unspecified: Secondary | ICD-10-CM

## 2011-11-19 ENCOUNTER — Ambulatory Visit
Admission: RE | Admit: 2011-11-19 | Discharge: 2011-11-19 | Disposition: A | Discharge status: Home / Self Care | Payer: Self-pay | Source: Ambulatory Visit | Attending: Internal Medicine | Admitting: Internal Medicine

## 2011-11-19 DIAGNOSIS — M542 Cervicalgia: Secondary | ICD-10-CM

## 2011-11-19 DIAGNOSIS — Z139 Encounter for screening, unspecified: Secondary | ICD-10-CM

## 2013-09-23 ENCOUNTER — Encounter: Payer: Self-pay | Admitting: Gastroenterology

## 2014-04-11 ENCOUNTER — Encounter: Payer: Self-pay | Admitting: Gastroenterology

## 2014-11-04 ENCOUNTER — Emergency Department (HOSPITAL_COMMUNITY)
Admission: EM | Admit: 2014-11-04 | Discharge: 2014-11-04 | Disposition: A | Discharge status: Home / Self Care | Payer: BC Managed Care – PPO | Source: Home / Self Care | Attending: Family Medicine | Admitting: Family Medicine

## 2014-11-04 ENCOUNTER — Ambulatory Visit (HOSPITAL_COMMUNITY): Payer: BC Managed Care – PPO | Attending: Family Medicine

## 2014-11-04 ENCOUNTER — Encounter (HOSPITAL_COMMUNITY): Payer: Self-pay | Admitting: *Deleted

## 2014-11-04 DIAGNOSIS — M25569 Pain in unspecified knee: Secondary | ICD-10-CM

## 2014-11-04 DIAGNOSIS — M25561 Pain in right knee: Secondary | ICD-10-CM | POA: Diagnosis present

## 2014-11-04 DIAGNOSIS — W010XXA Fall on same level from slipping, tripping and stumbling without subsequent striking against object, initial encounter: Secondary | ICD-10-CM | POA: Diagnosis not present

## 2014-11-04 DIAGNOSIS — M179 Osteoarthritis of knee, unspecified: Secondary | ICD-10-CM | POA: Insufficient documentation

## 2014-11-04 DIAGNOSIS — M11261 Other chondrocalcinosis, right knee: Secondary | ICD-10-CM | POA: Diagnosis not present

## 2014-11-04 DIAGNOSIS — S8991XA Unspecified injury of right lower leg, initial encounter: Secondary | ICD-10-CM | POA: Insufficient documentation

## 2014-11-04 DIAGNOSIS — M25559 Pain in unspecified hip: Secondary | ICD-10-CM

## 2014-11-04 DIAGNOSIS — M25551 Pain in right hip: Secondary | ICD-10-CM | POA: Insufficient documentation

## 2014-11-04 MED ORDER — DICLOFENAC SODIUM 50 MG PO TBEC: 50.0000 mg | DELAYED_RELEASE_TABLET | Freq: Two times a day (BID) | ORAL | Status: DC | PRN

## 2014-11-04 MED ORDER — HYDROCODONE-ACETAMINOPHEN 5-325 MG PO TABS: 1.0000 | ORAL_TABLET | Freq: Four times a day (QID) | ORAL | Status: DC | PRN

## 2014-11-04 NOTE — ED Provider Notes (Signed)
Travis White is a 53 y.o. male who presents to Urgent Care today for hip pain and knee pain. Patient slipped at home and his right leg was forcefully abducted at the hip. He notes continued groin pain as well as new onset knee pain over the last several days. He thinks he is limping from his hip and that is causing his knee pain. The pain is worse with activity better with rest. He's tried some over-the-counter medications which helped. Additionally he's tried some tramadol which did not help much.   Past Medical History  Diagnosis Date  . Diabetes mellitus    Past Surgical History  Procedure Laterality Date  . Left elbow     History  Substance Use Topics  . Smoking status: Current Every Day Smoker -- 1.50 packs/day    Types: Cigarettes  . Smokeless tobacco: Not on file  . Alcohol Use: No   ROS as above Medications: No current facility-administered medications for this encounter.   Current Outpatient Prescriptions  Medication Sig Dispense Refill  . insulin glargine (LANTUS) 100 UNIT/ML injection Inject 20 Units into the skin daily.    . metFORMIN (GLUCOPHAGE) 1000 MG tablet Take 1,000 mg by mouth 2 (two) times daily with a meal.    . diclofenac (VOLTAREN) 50 MG EC tablet Take 1 tablet (50 mg total) by mouth 2 (two) times daily as needed. 30 tablet 0  . HYDROcodone-acetaminophen (NORCO/VICODIN) 5-325 MG per tablet Take 1 tablet by mouth every 6 (six) hours as needed. 20 tablet 0   No Known Allergies   Exam:  BP 125/76 mmHg  Pulse 95  Temp(Src) 98.1 F (36.7 C) (Oral)  Resp 18  SpO2 99% Gen: Well NAD Right hip normal-appearing mildly tender at the greater trochanter. Pain with range of motion. Right knee normal-appearing no significant effusion. Diffusely tender. Significant pain with range of motion. Range of motions limited from 10-100. Stable ligamentous exam.  No results found for this or any previous visit (from the past 24 hour(s)). Dg Hip Complete  Right  11/04/2014   CLINICAL DATA:  Right hip pain post injury 5 days ago  EXAM: RIGHT HIP - COMPLETE 2+ VIEW  COMPARISON:  None.  FINDINGS: Three views of the right hip submitted. No acute fracture or subluxation. Bilateral hip joints are symmetrical in appearance.  IMPRESSION: Negative.   Electronically Signed   By: Natasha MeadLiviu  Pop M.D.   On: 11/04/2014 15:18   Dg Knee Complete 4 Views Right  11/04/2014   CLINICAL DATA:  Slipped and injury to the right leg. Pain in the right knee and hip.  EXAM: RIGHT KNEE - COMPLETE 4+ VIEW  COMPARISON:  None.  FINDINGS: Negative for fracture or dislocation. Bilateral chondrocalcinosis in the knee joint. Mild joint space narrowing with osteophytes along the medial knee compartment. Degenerative changes in patellofemoral compartment. Difficult to exclude a small suprapatellar joint effusion.  IMPRESSION: Mild osteoarthritis in the right knee with diffuse chondrocalcinosis.  No acute bone abnormality.   Electronically Signed   By: Richarda OverlieAdam  Henn M.D.   On: 11/04/2014 15:20    Assessment and Plan: 53 y.o. male with right knee and hip pain status post fall. No fractures. Plan to treat with diclofenac and Norco. Follow-up with PCP.  Discussed warning signs or symptoms. Please see discharge instructions. Patient expresses understanding.     Rodolph BongEvan S Sequoyah Counterman, MD 11/04/14 [phone]

## 2014-11-04 NOTE — Discharge Instructions (Signed)
Thank you for coming in today. Follow-up with orthopedics. Use crutches as needed   Arthralgia Your caregiver has diagnosed you as suffering from an arthralgia. Arthralgia means there is pain in a joint. This can come from many reasons including:  Bruising the joint which causes soreness (inflammation) in the joint.  Wear and tear on the joints which occur as we grow older (osteoarthritis).  Overusing the joint.  Various forms of arthritis.  Infections of the joint. Regardless of the cause of pain in your joint, most of these different pains respond to anti-inflammatory drugs and rest. The exception to this is when a joint is infected, and these cases are treated with antibiotics, if it is a bacterial infection. HOME CARE INSTRUCTIONS   Rest the injured area for as long as directed by your caregiver. Then slowly start using the joint as directed by your caregiver and as the pain allows. Crutches as directed may be useful if the ankles, knees or hips are involved. If the knee was splinted or casted, continue use and care as directed. If an stretchy or elastic wrapping bandage has been applied today, it should be removed and re-applied every 3 to 4 hours. It should not be applied tightly, but firmly enough to keep swelling down. Watch toes and feet for swelling, bluish discoloration, coldness, numbness or excessive pain. If any of these problems (symptoms) occur, remove the ace bandage and re-apply more loosely. If these symptoms persist, contact your caregiver or return to this location.  For the first 24 hours, keep the injured extremity elevated on pillows while lying down.  Apply ice for 15-20 minutes to the sore joint every couple hours while awake for the first half day. Then 03-04 times per day for the first 48 hours. Put the ice in a plastic bag and place a towel between the bag of ice and your skin.  Wear any splinting, casting, elastic bandage applications, or slings as  instructed.  Only take over-the-counter or prescription medicines for pain, discomfort, or fever as directed by your caregiver. Do not use aspirin immediately after the injury unless instructed by your physician. Aspirin can cause increased bleeding and bruising of the tissues.  If you were given crutches, continue to use them as instructed and do not resume weight bearing on the sore joint until instructed. Persistent pain and inability to use the sore joint as directed for more than 2 to 3 days are warning signs indicating that you should see a caregiver for a follow-up visit as soon as possible. Initially, a hairline fracture (break in bone) may not be evident on X-rays. Persistent pain and swelling indicate that further evaluation, non-weight bearing or use of the joint (use of crutches or slings as instructed), or further X-rays are indicated. X-rays may sometimes not show a small fracture until a week or 10 days later. Make a follow-up appointment with your own caregiver or one to whom we have referred you. A radiologist (specialist in reading X-rays) may read your X-rays. Make sure you know how you are to obtain your X-ray results. Do not assume everything is normal if you do not hear from us. SEEK MEDICAL CARE IF: Bruising, swelling, or pain increases. SEEK IMMEDIATE MEDICAL CARE IF:   Your fingers or toes are numb or blue.  The pain is not responding to medications and continues to stay the same or get worse.  The pain in your joint becomes severe.  You develop a fever over 102 F (38.9  C).  It becomes impossible to move or use the joint. MAKE SURE YOU:   Understand these instructions.  Will watch your condition.  Will get help right away if you are not doing well or get worse. Document Released: 10/27/2005 Document Revised: 01/19/2012 Document Reviewed: 06/14/2008 [facility] Patient Information 2015 [place], [place]. This information is not intended to replace advice given to you  by your health care provider. Make sure you discuss any questions you have with your health care provider.

## 2014-11-04 NOTE — ED Notes (Signed)
Early Monday morning stepped on toy, landing on left knee with "right leg going straight out to the side".  C/O continued right groin pain.  Called PCP - was given tramadol - has been taking every 6 hrs without relief, along with IBU.  Has some right knee pain now "from compensating".

## 2016-05-19 DIAGNOSIS — M109 Gout, unspecified: Secondary | ICD-10-CM | POA: Diagnosis not present

## 2016-05-19 DIAGNOSIS — Z720 Tobacco use: Secondary | ICD-10-CM | POA: Diagnosis not present

## 2016-05-19 DIAGNOSIS — E1165 Type 2 diabetes mellitus with hyperglycemia: Secondary | ICD-10-CM | POA: Diagnosis not present

## 2016-05-19 DIAGNOSIS — I10 Essential (primary) hypertension: Secondary | ICD-10-CM | POA: Diagnosis not present

## 2016-09-04 DIAGNOSIS — M109 Gout, unspecified: Secondary | ICD-10-CM | POA: Diagnosis not present

## 2016-09-04 DIAGNOSIS — L409 Psoriasis, unspecified: Secondary | ICD-10-CM | POA: Diagnosis not present

## 2016-09-04 DIAGNOSIS — I10 Essential (primary) hypertension: Secondary | ICD-10-CM | POA: Diagnosis not present

## 2016-09-04 DIAGNOSIS — Z1389 Encounter for screening for other disorder: Secondary | ICD-10-CM | POA: Diagnosis not present

## 2016-09-04 DIAGNOSIS — E1165 Type 2 diabetes mellitus with hyperglycemia: Secondary | ICD-10-CM | POA: Diagnosis not present

## 2016-09-23 DIAGNOSIS — M79645 Pain in left finger(s): Secondary | ICD-10-CM | POA: Diagnosis not present

## 2016-09-23 DIAGNOSIS — Z6835 Body mass index (BMI) 35.0-35.9, adult: Secondary | ICD-10-CM | POA: Diagnosis not present

## 2016-12-01 DIAGNOSIS — Z6834 Body mass index (BMI) 34.0-34.9, adult: Secondary | ICD-10-CM | POA: Diagnosis not present

## 2016-12-01 DIAGNOSIS — M25561 Pain in right knee: Secondary | ICD-10-CM | POA: Diagnosis not present

## 2016-12-01 DIAGNOSIS — M109 Gout, unspecified: Secondary | ICD-10-CM | POA: Diagnosis not present

## 2016-12-01 DIAGNOSIS — I10 Essential (primary) hypertension: Secondary | ICD-10-CM | POA: Diagnosis not present

## 2016-12-04 DIAGNOSIS — M109 Gout, unspecified: Secondary | ICD-10-CM | POA: Diagnosis not present

## 2016-12-04 DIAGNOSIS — I10 Essential (primary) hypertension: Secondary | ICD-10-CM | POA: Diagnosis not present

## 2016-12-04 DIAGNOSIS — E1165 Type 2 diabetes mellitus with hyperglycemia: Secondary | ICD-10-CM | POA: Diagnosis not present

## 2016-12-04 DIAGNOSIS — M25561 Pain in right knee: Secondary | ICD-10-CM | POA: Diagnosis not present

## 2017-03-11 ENCOUNTER — Ambulatory Visit (INDEPENDENT_AMBULATORY_CARE_PROVIDER_SITE_OTHER): Payer: BLUE CROSS/BLUE SHIELD

## 2017-03-11 ENCOUNTER — Ambulatory Visit (INDEPENDENT_AMBULATORY_CARE_PROVIDER_SITE_OTHER): Payer: BLUE CROSS/BLUE SHIELD | Admitting: Orthopaedic Surgery

## 2017-03-11 ENCOUNTER — Encounter (INDEPENDENT_AMBULATORY_CARE_PROVIDER_SITE_OTHER): Payer: Self-pay | Admitting: Orthopaedic Surgery

## 2017-03-11 VITALS — BP 141/81 | HR 75 | Ht 62.0 in | Wt 187.0 lb

## 2017-03-11 DIAGNOSIS — M25522 Pain in left elbow: Secondary | ICD-10-CM | POA: Diagnosis not present

## 2017-03-11 NOTE — Progress Notes (Signed)
Office Visit Note   Patient: Travis White           Date of Birth: 1961-08-15           MRN: [id number] Visit Date: 03/11/2017              Requested by: No referring provider defined for this encounter. PCP: No PCP Per Patient   Assessment & Plan: Visit Diagnoses:  1. Pain in left elbow           Previous ORIF Xu, humerus fracture on the left. Olecranon osteotomy was performed and he has a lag screw and figure-of-eight wire still present.  Plan: Patient has some thickening in the olecranon bursa likely from some contact. Spotting for about 2 weeks of gets worse he could always proceed with hardware removal from the olecranon removing the lag screw and the figure-of-eight wire if it continues to bother him. He'll try to avoid leaning on his elbow avoid bumping his elbow. He continues to have problems he can return and we can discuss the outpatient hardware removal for the olecranon. He's had good function of the arm post fixation of a very comminuted severe fracture that had intra-articular extension.  Follow-Up Instructions: No Follow-up on file.   Orders:  Orders Placed This Encounter  Procedures  . XR Elbow 2 Views Left   No orders of the defined types were placed in this encounter.     Procedures: No procedures performed   Clinical Data: No additional findings.   Subjective: Chief Complaint  Patient presents with  . Left Elbow - Pain    HPI patient's had two-week history of the some discomfort over the olecranon bursa on his left elbow. He had previously on osteotomy and plate fixation of a supracondylar humerus fracture from an on-the-job injury back in 2012. Fractured healed he noticed some fullness in a small area of tissue that tends to roll over the lag screw placed in the olecranon. He's not had any erythema no fever no chills. Patient is a diabetic on insulin and also Glucophage. He's been active and continued working.  Review of Systems  Constitutional:  Negative for chills and diaphoresis.  HENT: Negative for ear discharge, ear pain and nosebleeds.   Eyes: Negative for discharge and visual disturbance.  Respiratory: Negative for cough, choking and shortness of breath.   Cardiovascular: Negative for chest pain and palpitations.  Gastrointestinal: Negative for abdominal distention and abdominal pain.  Endocrine: Negative for cold intolerance and heat intolerance.       Positive for diabetes on insulin  Genitourinary: Negative for flank pain and hematuria.  Musculoskeletal:       Week history of left elbow discomfort when he applies pressure over the olecranon. Previous elbow fracture with the ORIF supracondylar humerus fracture 2012.  Skin: Negative for rash and wound.  Neurological: Negative for seizures and speech difficulty.  Hematological: Negative for adenopathy. Does not bruise/bleed easily.  Psychiatric/Behavioral: Negative for agitation and suicidal ideas.     Objective: Vital Signs: BP (!) 141/81   Pulse 75   Ht  (1.575 m)   Wt 187 lb (84.8 kg)   BMI 34.20 kg/m   Physical Exam  Constitutional: He is oriented to person, place, and time. He appears well-developed and well-nourished.  HENT:  Head: Normocephalic and atraumatic.  Eyes: EOM are normal. Pupils are equal, round, and reactive to light.  Neck: No tracheal deviation present. No thyromegaly present.  Cardiovascular: Normal rate.  Pulmonary/Chest: Effort normal. He has no wheezes.  Abdominal: Soft. Bowel sounds are normal.  Musculoskeletal:  The patient has well-healed posterior incision with the olecranon osteotomy that was performed an ORIF of humerus. No erythema. Incision is well-healed. Has small areas about 2 x 3 mm of the scar tissue over the olecranon bursa which is slightly tender. He has 20-130 range of motion of his left elbow without pain.  Neurological: He is alert and oriented to person, place, and time.  Skin: Skin is warm and dry. Capillary  refill takes less than 2 seconds.  Psychiatric: He has a normal mood and affect. His behavior is normal. Judgment and thought content normal.    Ortho Exam  Specialty Comments:  No specialty comments available.  Imaging: Xr Elbow 2 Views Left  Result Date: 03/11/2017 AP lateral x-rays left elbow obtained. This shows previous supracondylar T type fracture fixed with medial lateral plating. Patient also had the radial neck fracture which is healed. He has slight spurring at the radial capitellar joint. Supracondylar fracture is healed. No evidence of loosening or hardware failure. Impression: Healed supracondylar humerus fracture. Healed radial neck fracture. Mild spurring noted from some mild posttraumatic osteoarthritis.    PMFS History: There are no active problems to display for this patient.  Past Medical History:  Diagnosis Date  . Diabetes mellitus     No family history on file.  Past Surgical History:  Procedure Laterality Date  . Left elbow     Social History   Occupational History  . Not on file.   Social History Main Topics  . Smoking status: Current Every Day Smoker    Packs/day: 1.50    Types: Cigarettes  . Smokeless tobacco: Never Used  . Alcohol use No  . Drug use: No  . Sexual activity: Not on file

## 2017-03-20 DIAGNOSIS — Z1389 Encounter for screening for other disorder: Secondary | ICD-10-CM | POA: Diagnosis not present

## 2017-03-20 DIAGNOSIS — E784 Other hyperlipidemia: Secondary | ICD-10-CM | POA: Diagnosis not present

## 2017-03-20 DIAGNOSIS — M109 Gout, unspecified: Secondary | ICD-10-CM | POA: Diagnosis not present

## 2017-03-20 DIAGNOSIS — I10 Essential (primary) hypertension: Secondary | ICD-10-CM | POA: Diagnosis not present

## 2017-03-20 DIAGNOSIS — E1165 Type 2 diabetes mellitus with hyperglycemia: Secondary | ICD-10-CM | POA: Diagnosis not present

## 2017-04-07 DIAGNOSIS — M5489 Other dorsalgia: Secondary | ICD-10-CM | POA: Diagnosis not present

## 2017-04-07 DIAGNOSIS — M542 Cervicalgia: Secondary | ICD-10-CM | POA: Diagnosis not present

## 2017-04-07 DIAGNOSIS — Z6834 Body mass index (BMI) 34.0-34.9, adult: Secondary | ICD-10-CM | POA: Diagnosis not present

## 2017-08-07 DIAGNOSIS — I10 Essential (primary) hypertension: Secondary | ICD-10-CM | POA: Diagnosis not present

## 2017-08-07 DIAGNOSIS — Z720 Tobacco use: Secondary | ICD-10-CM | POA: Diagnosis not present

## 2017-08-07 DIAGNOSIS — E1151 Type 2 diabetes mellitus with diabetic peripheral angiopathy without gangrene: Secondary | ICD-10-CM | POA: Diagnosis not present

## 2017-08-07 DIAGNOSIS — M109 Gout, unspecified: Secondary | ICD-10-CM | POA: Diagnosis not present

## 2017-09-30 DIAGNOSIS — M19032 Primary osteoarthritis, left wrist: Secondary | ICD-10-CM | POA: Diagnosis not present

## 2017-09-30 DIAGNOSIS — M25532 Pain in left wrist: Secondary | ICD-10-CM | POA: Diagnosis not present

## 2017-09-30 DIAGNOSIS — Z8739 Personal history of other diseases of the musculoskeletal system and connective tissue: Secondary | ICD-10-CM | POA: Diagnosis not present

## 2017-10-09 DIAGNOSIS — H35033 Hypertensive retinopathy, bilateral: Secondary | ICD-10-CM | POA: Diagnosis not present

## 2017-10-09 DIAGNOSIS — H2513 Age-related nuclear cataract, bilateral: Secondary | ICD-10-CM | POA: Diagnosis not present

## 2017-10-09 DIAGNOSIS — E119 Type 2 diabetes mellitus without complications: Secondary | ICD-10-CM | POA: Diagnosis not present

## 2017-10-09 DIAGNOSIS — I7090 Unspecified atherosclerosis: Secondary | ICD-10-CM | POA: Diagnosis not present

## 2017-11-16 DIAGNOSIS — E1151 Type 2 diabetes mellitus with diabetic peripheral angiopathy without gangrene: Secondary | ICD-10-CM | POA: Diagnosis not present

## 2017-11-16 DIAGNOSIS — Z794 Long term (current) use of insulin: Secondary | ICD-10-CM | POA: Diagnosis not present

## 2017-11-16 DIAGNOSIS — I10 Essential (primary) hypertension: Secondary | ICD-10-CM | POA: Diagnosis not present

## 2017-11-16 DIAGNOSIS — L408 Other psoriasis: Secondary | ICD-10-CM | POA: Diagnosis not present

## 2017-11-16 DIAGNOSIS — Z1389 Encounter for screening for other disorder: Secondary | ICD-10-CM | POA: Diagnosis not present

## 2018-04-27 DIAGNOSIS — Z0000 Encounter for general adult medical examination without abnormal findings: Secondary | ICD-10-CM | POA: Diagnosis not present

## 2018-04-27 DIAGNOSIS — M109 Gout, unspecified: Secondary | ICD-10-CM | POA: Diagnosis not present

## 2018-04-27 DIAGNOSIS — E1151 Type 2 diabetes mellitus with diabetic peripheral angiopathy without gangrene: Secondary | ICD-10-CM | POA: Diagnosis not present

## 2018-04-27 DIAGNOSIS — Z125 Encounter for screening for malignant neoplasm of prostate: Secondary | ICD-10-CM | POA: Diagnosis not present

## 2018-04-27 DIAGNOSIS — R82998 Other abnormal findings in urine: Secondary | ICD-10-CM | POA: Diagnosis not present

## 2018-05-04 DIAGNOSIS — I10 Essential (primary) hypertension: Secondary | ICD-10-CM | POA: Diagnosis not present

## 2018-05-04 DIAGNOSIS — Z794 Long term (current) use of insulin: Secondary | ICD-10-CM | POA: Diagnosis not present

## 2018-05-04 DIAGNOSIS — Z0000 Encounter for general adult medical examination without abnormal findings: Secondary | ICD-10-CM | POA: Diagnosis not present

## 2018-05-04 DIAGNOSIS — E1151 Type 2 diabetes mellitus with diabetic peripheral angiopathy without gangrene: Secondary | ICD-10-CM | POA: Diagnosis not present

## 2018-05-04 DIAGNOSIS — E668 Other obesity: Secondary | ICD-10-CM | POA: Diagnosis not present

## 2018-06-23 DIAGNOSIS — J069 Acute upper respiratory infection, unspecified: Secondary | ICD-10-CM | POA: Diagnosis not present

## 2018-06-23 DIAGNOSIS — R110 Nausea: Secondary | ICD-10-CM | POA: Diagnosis not present

## 2018-06-23 DIAGNOSIS — H6691 Otitis media, unspecified, right ear: Secondary | ICD-10-CM | POA: Diagnosis not present

## 2018-06-23 DIAGNOSIS — R05 Cough: Secondary | ICD-10-CM | POA: Diagnosis not present

## 2018-09-27 DIAGNOSIS — Z794 Long term (current) use of insulin: Secondary | ICD-10-CM | POA: Diagnosis not present

## 2018-09-27 DIAGNOSIS — E668 Other obesity: Secondary | ICD-10-CM | POA: Diagnosis not present

## 2018-09-27 DIAGNOSIS — I10 Essential (primary) hypertension: Secondary | ICD-10-CM | POA: Diagnosis not present

## 2018-09-27 DIAGNOSIS — E1151 Type 2 diabetes mellitus with diabetic peripheral angiopathy without gangrene: Secondary | ICD-10-CM | POA: Diagnosis not present

## 2018-11-09 DIAGNOSIS — E1151 Type 2 diabetes mellitus with diabetic peripheral angiopathy without gangrene: Secondary | ICD-10-CM | POA: Diagnosis not present

## 2018-11-09 DIAGNOSIS — E669 Obesity, unspecified: Secondary | ICD-10-CM | POA: Diagnosis not present

## 2018-11-09 DIAGNOSIS — Z794 Long term (current) use of insulin: Secondary | ICD-10-CM | POA: Diagnosis not present

## 2018-11-09 DIAGNOSIS — I10 Essential (primary) hypertension: Secondary | ICD-10-CM | POA: Diagnosis not present

## 2018-12-22 DIAGNOSIS — I10 Essential (primary) hypertension: Secondary | ICD-10-CM | POA: Diagnosis not present

## 2018-12-22 DIAGNOSIS — Z794 Long term (current) use of insulin: Secondary | ICD-10-CM | POA: Diagnosis not present

## 2018-12-22 DIAGNOSIS — E1151 Type 2 diabetes mellitus with diabetic peripheral angiopathy without gangrene: Secondary | ICD-10-CM | POA: Diagnosis not present

## 2019-01-25 DIAGNOSIS — E7849 Other hyperlipidemia: Secondary | ICD-10-CM | POA: Diagnosis not present

## 2019-01-25 DIAGNOSIS — I10 Essential (primary) hypertension: Secondary | ICD-10-CM | POA: Diagnosis not present

## 2019-01-25 DIAGNOSIS — E1151 Type 2 diabetes mellitus with diabetic peripheral angiopathy without gangrene: Secondary | ICD-10-CM | POA: Diagnosis not present

## 2019-01-25 DIAGNOSIS — Z794 Long term (current) use of insulin: Secondary | ICD-10-CM | POA: Diagnosis not present

## 2019-01-25 DIAGNOSIS — Z1331 Encounter for screening for depression: Secondary | ICD-10-CM | POA: Diagnosis not present

## 2019-03-15 DIAGNOSIS — Z794 Long term (current) use of insulin: Secondary | ICD-10-CM | POA: Diagnosis not present

## 2019-03-15 DIAGNOSIS — I10 Essential (primary) hypertension: Secondary | ICD-10-CM | POA: Diagnosis not present

## 2019-03-15 DIAGNOSIS — E1151 Type 2 diabetes mellitus with diabetic peripheral angiopathy without gangrene: Secondary | ICD-10-CM | POA: Diagnosis not present

## 2019-03-31 DIAGNOSIS — K635 Polyp of colon: Secondary | ICD-10-CM | POA: Diagnosis not present

## 2019-03-31 DIAGNOSIS — K5730 Diverticulosis of large intestine without perforation or abscess without bleeding: Secondary | ICD-10-CM | POA: Diagnosis not present

## 2019-03-31 DIAGNOSIS — Z1211 Encounter for screening for malignant neoplasm of colon: Secondary | ICD-10-CM | POA: Diagnosis not present

## 2019-03-31 DIAGNOSIS — Z86010 Personal history of colonic polyps: Secondary | ICD-10-CM | POA: Diagnosis not present

## 2019-03-31 DIAGNOSIS — K648 Other hemorrhoids: Secondary | ICD-10-CM | POA: Diagnosis not present

## 2019-03-31 DIAGNOSIS — D125 Benign neoplasm of sigmoid colon: Secondary | ICD-10-CM | POA: Diagnosis not present

## 2019-03-31 DIAGNOSIS — D124 Benign neoplasm of descending colon: Secondary | ICD-10-CM | POA: Diagnosis not present

## 2019-06-06 DIAGNOSIS — M109 Gout, unspecified: Secondary | ICD-10-CM | POA: Diagnosis not present

## 2019-06-06 DIAGNOSIS — Z125 Encounter for screening for malignant neoplasm of prostate: Secondary | ICD-10-CM | POA: Diagnosis not present

## 2019-06-06 DIAGNOSIS — E1151 Type 2 diabetes mellitus with diabetic peripheral angiopathy without gangrene: Secondary | ICD-10-CM | POA: Diagnosis not present

## 2019-06-06 DIAGNOSIS — Z0000 Encounter for general adult medical examination without abnormal findings: Secondary | ICD-10-CM | POA: Diagnosis not present

## 2019-06-06 DIAGNOSIS — E7849 Other hyperlipidemia: Secondary | ICD-10-CM | POA: Diagnosis not present

## 2019-06-06 DIAGNOSIS — I10 Essential (primary) hypertension: Secondary | ICD-10-CM | POA: Diagnosis not present

## 2019-06-06 DIAGNOSIS — R82998 Other abnormal findings in urine: Secondary | ICD-10-CM | POA: Diagnosis not present

## 2019-06-13 DIAGNOSIS — Z0000 Encounter for general adult medical examination without abnormal findings: Secondary | ICD-10-CM | POA: Diagnosis not present

## 2019-06-13 DIAGNOSIS — E7849 Other hyperlipidemia: Secondary | ICD-10-CM | POA: Diagnosis not present

## 2019-06-13 DIAGNOSIS — I10 Essential (primary) hypertension: Secondary | ICD-10-CM | POA: Diagnosis not present

## 2019-06-13 DIAGNOSIS — Z794 Long term (current) use of insulin: Secondary | ICD-10-CM | POA: Diagnosis not present

## 2019-06-13 DIAGNOSIS — E1151 Type 2 diabetes mellitus with diabetic peripheral angiopathy without gangrene: Secondary | ICD-10-CM | POA: Diagnosis not present

## 2019-06-22 DIAGNOSIS — Z794 Long term (current) use of insulin: Secondary | ICD-10-CM | POA: Diagnosis not present

## 2019-06-22 DIAGNOSIS — Z720 Tobacco use: Secondary | ICD-10-CM | POA: Diagnosis not present

## 2019-06-22 DIAGNOSIS — I10 Essential (primary) hypertension: Secondary | ICD-10-CM | POA: Diagnosis not present

## 2019-06-22 DIAGNOSIS — E1151 Type 2 diabetes mellitus with diabetic peripheral angiopathy without gangrene: Secondary | ICD-10-CM | POA: Diagnosis not present

## 2019-08-10 DIAGNOSIS — Z794 Long term (current) use of insulin: Secondary | ICD-10-CM | POA: Diagnosis not present

## 2019-08-10 DIAGNOSIS — Z23 Encounter for immunization: Secondary | ICD-10-CM | POA: Diagnosis not present

## 2019-08-10 DIAGNOSIS — I10 Essential (primary) hypertension: Secondary | ICD-10-CM | POA: Diagnosis not present

## 2019-08-10 DIAGNOSIS — E1151 Type 2 diabetes mellitus with diabetic peripheral angiopathy without gangrene: Secondary | ICD-10-CM | POA: Diagnosis not present

## 2019-09-19 DIAGNOSIS — Z794 Long term (current) use of insulin: Secondary | ICD-10-CM | POA: Diagnosis not present

## 2019-09-19 DIAGNOSIS — E1151 Type 2 diabetes mellitus with diabetic peripheral angiopathy without gangrene: Secondary | ICD-10-CM | POA: Diagnosis not present

## 2019-09-19 DIAGNOSIS — E785 Hyperlipidemia, unspecified: Secondary | ICD-10-CM | POA: Diagnosis not present

## 2019-09-19 DIAGNOSIS — I10 Essential (primary) hypertension: Secondary | ICD-10-CM | POA: Diagnosis not present

## 2019-11-15 DIAGNOSIS — E1151 Type 2 diabetes mellitus with diabetic peripheral angiopathy without gangrene: Secondary | ICD-10-CM | POA: Diagnosis not present

## 2019-11-15 DIAGNOSIS — Z794 Long term (current) use of insulin: Secondary | ICD-10-CM | POA: Diagnosis not present

## 2019-11-15 DIAGNOSIS — I10 Essential (primary) hypertension: Secondary | ICD-10-CM | POA: Diagnosis not present

## 2020-01-08 DIAGNOSIS — M25572 Pain in left ankle and joints of left foot: Secondary | ICD-10-CM | POA: Diagnosis not present

## 2020-01-08 DIAGNOSIS — M11262 Other chondrocalcinosis, left knee: Secondary | ICD-10-CM | POA: Diagnosis not present

## 2020-01-08 DIAGNOSIS — W108XXA Fall (on) (from) other stairs and steps, initial encounter: Secondary | ICD-10-CM | POA: Diagnosis not present

## 2020-01-08 DIAGNOSIS — M25562 Pain in left knee: Secondary | ICD-10-CM | POA: Diagnosis not present

## 2020-01-13 DIAGNOSIS — Z794 Long term (current) use of insulin: Secondary | ICD-10-CM | POA: Diagnosis not present

## 2020-01-13 DIAGNOSIS — E1151 Type 2 diabetes mellitus with diabetic peripheral angiopathy without gangrene: Secondary | ICD-10-CM | POA: Diagnosis not present

## 2020-01-13 DIAGNOSIS — I10 Essential (primary) hypertension: Secondary | ICD-10-CM | POA: Diagnosis not present

## 2020-01-13 DIAGNOSIS — M25561 Pain in right knee: Secondary | ICD-10-CM | POA: Diagnosis not present

## 2020-01-16 ENCOUNTER — Ambulatory Visit: Payer: Self-pay

## 2020-01-16 ENCOUNTER — Encounter: Payer: Self-pay | Admitting: Orthopedic Surgery

## 2020-01-16 ENCOUNTER — Ambulatory Visit (INDEPENDENT_AMBULATORY_CARE_PROVIDER_SITE_OTHER): Payer: BC Managed Care – PPO | Admitting: Orthopedic Surgery

## 2020-01-16 ENCOUNTER — Other Ambulatory Visit: Payer: Self-pay

## 2020-01-16 VITALS — Ht 62.0 in | Wt 191.6 lb

## 2020-01-16 DIAGNOSIS — M1120 Other chondrocalcinosis, unspecified site: Secondary | ICD-10-CM | POA: Diagnosis not present

## 2020-01-16 DIAGNOSIS — M25561 Pain in right knee: Secondary | ICD-10-CM

## 2020-01-16 NOTE — Progress Notes (Signed)
Office Visit Note   Patient: Travis White           Date of Birth: 06/17/1961           MRN: [id number] Visit Date: 01/16/2020              Requested by: No referring provider defined for this encounter. PCP: Patient, No Pcp Per  Chief Complaint  Patient presents with  . Right Knee - Pain      HPI: This is a pleasant 59 year old gentleman with a chief complaint of right medial knee pain he denies any specific injury.  He does state that he has had problems with his left knee and was relying on his right knee a lot more.  Pain is focused over the medial joint line.  He was given a cortisone injection by his Dr. Sharlett Iles on Friday he thinks it is helped a little bit  Assessment & Plan: Visit Diagnoses:  1. Acute pain of right knee     Plan: Findings consistent with right knee chondrocalcinosis I shared the x-rays with the patient and discussed the natural history of this.  I also discussed this with Dr. Sharol Given.  He will get on a regular course of anti-inflammatories with food.  We would like to give the injection of full 2 weeks to see how much relief he gets.  Unfortunately the only option would be a right knee replacement.  He will follow-up with Dr. Sharol Given at that time  Follow-Up Instructions: No follow-ups on file.   Ortho Exam  Patient is alert, oriented, no adenopathy, well-dressed, normal affect, normal respiratory effort. Right knee: No effusion no erythema no warmth he does have some crepitus with range of motion.  He is focally tender over the medial joint line.  He does have gross stability.  No tenderness over the insertion of the MCL Imaging: No results found. No images are attached to the encounter.  Labs: Lab Results  Component Value Date   HGBA1C (H) 12/12/2010    8.7 (NOTE)                                                                       According to the ADA Clinical Practice Recommendations for 2011, when HbA1c is used as a screening test:   >=6.5%    Diagnostic of Diabetes Mellitus           (if abnormal result  is confirmed)  5.7-6.4%   Increased risk of developing Diabetes Mellitus  References:Diagnosis and Classification of Diabetes Mellitus,Diabetes XBMW,4132,44(WNUUV 1):S62-S69 and Standards of Medical Care in         Diabetes - 2011,Diabetes Care,2011,34  (Suppl 1):S11-S61.     No results found for: ALBUMIN, PREALBUMIN, LABURIC  No results found for: MG No results found for: VD25OH  No results found for: PREALBUMIN CBC EXTENDED Latest Ref Rng & Units 12/13/2010 12/12/2010 02/23/2008  HGB 13.0 - 17.0 g/dL 11.0(L) 12.2(L) 15.2 POINT OF CARE RESULT  HCT 39.0 - 52.0 % 33.2(L) 35.5(L) -     Body mass index is 35.04 kg/m.  Orders:  Orders Placed This Encounter  Procedures  . XR KNEE 3 VIEW RIGHT   No orders of the defined types were placed  in this encounter.    Procedures: No procedures performed  Clinical Data: No additional findings.  ROS:  All other systems negative, except as noted in the HPI. Review of Systems  Objective: Vital Signs: Ht 5\' 2"  (1.575 m)   Wt 191 lb 9.6 oz (86.9 kg)   BMI 35.04 kg/m   Specialty Comments:  No specialty comments available.  PMFS History: There are no problems to display for this patient.  Past Medical History:  Diagnosis Date  . Diabetes mellitus     No family history on file.  Past Surgical History:  Procedure Laterality Date  . Left elbow     Social History   Occupational History  . Not on file  Tobacco Use  . Smoking status: Current Every Day Smoker    Packs/day: 1.50    Types: Cigarettes  . Smokeless tobacco: Never Used  Substance and Sexual Activity  . Alcohol use: No  . Drug use: No  . Sexual activity: Not on file

## 2020-01-30 ENCOUNTER — Other Ambulatory Visit: Payer: Self-pay

## 2020-01-30 ENCOUNTER — Encounter: Payer: Self-pay | Admitting: Physician Assistant

## 2020-01-30 ENCOUNTER — Ambulatory Visit (INDEPENDENT_AMBULATORY_CARE_PROVIDER_SITE_OTHER): Payer: BC Managed Care – PPO | Admitting: Orthopedic Surgery

## 2020-01-30 VITALS — Ht 62.0 in | Wt 191.0 lb

## 2020-01-30 DIAGNOSIS — M25561 Pain in right knee: Secondary | ICD-10-CM

## 2020-01-30 DIAGNOSIS — M1711 Unilateral primary osteoarthritis, right knee: Secondary | ICD-10-CM

## 2020-01-31 ENCOUNTER — Encounter: Payer: Self-pay | Admitting: Orthopedic Surgery

## 2020-01-31 NOTE — Progress Notes (Signed)
Office Visit Note   Patient: Travis White           Date of Birth: 1961-10-31           MRN: [id number] Visit Date: 01/30/2020              Requested by: No referring provider defined for this encounter. PCP: Patient, No Pcp Per  Chief Complaint  Patient presents with  . Right Knee - Follow-up      HPI: Patient is a 59 year old gentleman who was seen for evaluation for osteoarthritis right knee.  Patient did have a steroid injection with Dr. Philip Aspen on 01/13/2020.  Patient states he did not have sufficient relief with the injection.  Patient states he has pain with activities of daily living pain going up and down stairs pain with getting up from a sitting position.  Past medical history patient states that he used to smoke 3 packs/day he states he is now down to a half a pack a day.  Patient's most recent hemoglobin A1c is 9.  Assessment & Plan: Visit Diagnoses:  1. Acute pain of right knee   2. Unilateral primary osteoarthritis, right knee     Plan: Discussed treatment options including total knee replacement patient states he would like to proceed with a total knee arthroplasty risk and benefits were discussed including infection neurovascular injury persistent pain need for additional surgery.  Discussed increased risk of infection complication with uncontrolled diabetes as well as with smoking.  Recommended that he stop smoking prior to surgery and remain stop smoking for 3 months also recommended that we get his hemoglobin A1c down to 8.  Recommended simply eliminating carbohydrates and sugar.  Patient states he understands wishes to proceed with total knee arthroplasty.  Follow-Up Instructions: Return if symptoms worsen or fail to improve.   Ortho Exam  Patient is alert, oriented, no adenopathy, well-dressed, normal affect, normal respiratory effort. Examination patient has difficulty getting from a sitting to a standing position he has crepitation with range of motion  lacks full extension has flexion to 90 degrees he is tender to palpation of the medial lateral joint line crepitation with range of motion collaterals and cruciates are stable radiographs shows tricompartmental arthritic changes with calcification of the meniscus.  Imaging: No results found. No images are attached to the encounter.  Labs: Lab Results  Component Value Date   HGBA1C (H) 12/12/2010    8.7 (NOTE)                                                                       According to the ADA Clinical Practice Recommendations for 2011, when HbA1c is used as a screening test:   >=6.5%   Diagnostic of Diabetes Mellitus           (if abnormal result  is confirmed)  5.7-6.4%   Increased risk of developing Diabetes Mellitus  References:Diagnosis and Classification of Diabetes Mellitus,Diabetes PYPP,5093,26(ZTIWP 1):S62-S69 and Standards of Medical Care in         Diabetes - 2011,Diabetes Care,2011,34  (Suppl 1):S11-S61.     No results found for: ALBUMIN, PREALBUMIN, LABURIC  No results found for: MG No results found for: VD25OH  No results found for: PREALBUMIN  CBC EXTENDED Latest Ref Rng & Units 12/13/2010 12/12/2010 02/23/2008  HGB 13.0 - 17.0 g/dL 11.0(L) 12.2(L) 15.2 POINT OF CARE RESULT  HCT 39.0 - 52.0 % 33.2(L) 35.5(L) -     Body mass index is 34.93 kg/m.  Orders:  No orders of the defined types were placed in this encounter.  No orders of the defined types were placed in this encounter.    Procedures: No procedures performed  Clinical Data: No additional findings.  ROS:  All other systems negative, except as noted in the HPI. Review of Systems  Objective: Vital Signs: Ht 5\' 2"  (1.575 m)   Wt 191 lb (86.6 kg)   BMI 34.93 kg/m   Specialty Comments:  No specialty comments available.  PMFS History: There are no problems to display for this patient.  Past Medical History:  Diagnosis Date  . Diabetes mellitus     History reviewed. No pertinent family  history.  Past Surgical History:  Procedure Laterality Date  . Left elbow     Social History   Occupational History  . Not on file  Tobacco Use  . Smoking status: Current Every Day Smoker    Packs/day: 1.50    Types: Cigarettes  . Smokeless tobacco: Never Used  Substance and Sexual Activity  . Alcohol use: No  . Drug use: No  . Sexual activity: Not on file

## 2020-02-17 ENCOUNTER — Telehealth: Payer: Self-pay | Admitting: Orthopedic Surgery

## 2020-02-17 NOTE — Telephone Encounter (Signed)
I called Travis White to discuss scheduling his total knee replacement.  Left message on his voicemail to please return my call.

## 2020-02-20 DIAGNOSIS — E1151 Type 2 diabetes mellitus with diabetic peripheral angiopathy without gangrene: Secondary | ICD-10-CM | POA: Diagnosis not present

## 2020-03-15 DIAGNOSIS — I10 Essential (primary) hypertension: Secondary | ICD-10-CM | POA: Diagnosis not present

## 2020-03-15 DIAGNOSIS — E1151 Type 2 diabetes mellitus with diabetic peripheral angiopathy without gangrene: Secondary | ICD-10-CM | POA: Diagnosis not present

## 2020-03-15 DIAGNOSIS — Z794 Long term (current) use of insulin: Secondary | ICD-10-CM | POA: Diagnosis not present

## 2020-05-07 DIAGNOSIS — M109 Gout, unspecified: Secondary | ICD-10-CM | POA: Diagnosis not present

## 2020-05-07 DIAGNOSIS — M170 Bilateral primary osteoarthritis of knee: Secondary | ICD-10-CM | POA: Diagnosis not present

## 2020-05-07 DIAGNOSIS — M25562 Pain in left knee: Secondary | ICD-10-CM | POA: Diagnosis not present

## 2020-05-07 DIAGNOSIS — M25572 Pain in left ankle and joints of left foot: Secondary | ICD-10-CM | POA: Diagnosis not present

## 2020-06-08 DIAGNOSIS — E1151 Type 2 diabetes mellitus with diabetic peripheral angiopathy without gangrene: Secondary | ICD-10-CM | POA: Diagnosis not present

## 2020-06-08 DIAGNOSIS — Z125 Encounter for screening for malignant neoplasm of prostate: Secondary | ICD-10-CM | POA: Diagnosis not present

## 2020-06-08 DIAGNOSIS — E7849 Other hyperlipidemia: Secondary | ICD-10-CM | POA: Diagnosis not present

## 2020-06-08 DIAGNOSIS — M109 Gout, unspecified: Secondary | ICD-10-CM | POA: Diagnosis not present

## 2020-06-08 DIAGNOSIS — Z0000 Encounter for general adult medical examination without abnormal findings: Secondary | ICD-10-CM | POA: Diagnosis not present

## 2020-06-15 DIAGNOSIS — I10 Essential (primary) hypertension: Secondary | ICD-10-CM | POA: Diagnosis not present

## 2020-06-15 DIAGNOSIS — R82998 Other abnormal findings in urine: Secondary | ICD-10-CM | POA: Diagnosis not present

## 2020-06-15 DIAGNOSIS — Z720 Tobacco use: Secondary | ICD-10-CM | POA: Diagnosis not present

## 2020-06-15 DIAGNOSIS — E1151 Type 2 diabetes mellitus with diabetic peripheral angiopathy without gangrene: Secondary | ICD-10-CM | POA: Diagnosis not present

## 2020-06-27 DIAGNOSIS — B0089 Other herpesviral infection: Secondary | ICD-10-CM | POA: Diagnosis not present

## 2020-06-27 DIAGNOSIS — M79641 Pain in right hand: Secondary | ICD-10-CM | POA: Diagnosis not present

## 2020-06-27 DIAGNOSIS — M7989 Other specified soft tissue disorders: Secondary | ICD-10-CM | POA: Diagnosis not present

## 2020-06-28 DIAGNOSIS — B0089 Other herpesviral infection: Secondary | ICD-10-CM | POA: Diagnosis not present

## 2020-06-28 DIAGNOSIS — M19041 Primary osteoarthritis, right hand: Secondary | ICD-10-CM | POA: Diagnosis not present

## 2020-06-28 DIAGNOSIS — M1811 Unilateral primary osteoarthritis of first carpometacarpal joint, right hand: Secondary | ICD-10-CM | POA: Diagnosis not present

## 2020-07-09 DIAGNOSIS — L309 Dermatitis, unspecified: Secondary | ICD-10-CM | POA: Diagnosis not present

## 2020-07-12 DIAGNOSIS — B0089 Other herpesviral infection: Secondary | ICD-10-CM | POA: Diagnosis not present

## 2020-07-12 DIAGNOSIS — Z4789 Encounter for other orthopedic aftercare: Secondary | ICD-10-CM | POA: Diagnosis not present

## 2020-07-17 DIAGNOSIS — L304 Erythema intertrigo: Secondary | ICD-10-CM | POA: Diagnosis not present

## 2020-07-17 DIAGNOSIS — L309 Dermatitis, unspecified: Secondary | ICD-10-CM | POA: Diagnosis not present

## 2020-07-26 DIAGNOSIS — E1151 Type 2 diabetes mellitus with diabetic peripheral angiopathy without gangrene: Secondary | ICD-10-CM | POA: Diagnosis not present

## 2020-09-13 DIAGNOSIS — N401 Enlarged prostate with lower urinary tract symptoms: Secondary | ICD-10-CM | POA: Diagnosis not present

## 2020-09-13 DIAGNOSIS — R350 Frequency of micturition: Secondary | ICD-10-CM | POA: Diagnosis not present

## 2020-09-13 DIAGNOSIS — R351 Nocturia: Secondary | ICD-10-CM | POA: Diagnosis not present

## 2020-09-13 DIAGNOSIS — R311 Benign essential microscopic hematuria: Secondary | ICD-10-CM | POA: Diagnosis not present

## 2020-10-09 DIAGNOSIS — N401 Enlarged prostate with lower urinary tract symptoms: Secondary | ICD-10-CM | POA: Diagnosis not present

## 2020-10-09 DIAGNOSIS — R311 Benign essential microscopic hematuria: Secondary | ICD-10-CM | POA: Diagnosis not present

## 2020-10-09 DIAGNOSIS — R350 Frequency of micturition: Secondary | ICD-10-CM | POA: Diagnosis not present

## 2020-10-19 DIAGNOSIS — I10 Essential (primary) hypertension: Secondary | ICD-10-CM | POA: Diagnosis not present

## 2020-10-19 DIAGNOSIS — E785 Hyperlipidemia, unspecified: Secondary | ICD-10-CM | POA: Diagnosis not present

## 2020-11-07 DIAGNOSIS — M47816 Spondylosis without myelopathy or radiculopathy, lumbar region: Secondary | ICD-10-CM | POA: Diagnosis not present

## 2020-11-07 DIAGNOSIS — K760 Fatty (change of) liver, not elsewhere classified: Secondary | ICD-10-CM | POA: Diagnosis not present

## 2020-11-07 DIAGNOSIS — R3129 Other microscopic hematuria: Secondary | ICD-10-CM | POA: Diagnosis not present

## 2020-11-07 DIAGNOSIS — R311 Benign essential microscopic hematuria: Secondary | ICD-10-CM | POA: Diagnosis not present

## 2020-11-07 DIAGNOSIS — K429 Umbilical hernia without obstruction or gangrene: Secondary | ICD-10-CM | POA: Diagnosis not present

## 2020-11-29 DIAGNOSIS — H524 Presbyopia: Secondary | ICD-10-CM | POA: Diagnosis not present

## 2020-11-29 DIAGNOSIS — H2513 Age-related nuclear cataract, bilateral: Secondary | ICD-10-CM | POA: Diagnosis not present

## 2020-11-29 DIAGNOSIS — H35033 Hypertensive retinopathy, bilateral: Secondary | ICD-10-CM | POA: Diagnosis not present

## 2020-11-29 DIAGNOSIS — H25013 Cortical age-related cataract, bilateral: Secondary | ICD-10-CM | POA: Diagnosis not present

## 2020-11-29 DIAGNOSIS — H35013 Changes in retinal vascular appearance, bilateral: Secondary | ICD-10-CM | POA: Diagnosis not present

## 2020-11-29 DIAGNOSIS — E119 Type 2 diabetes mellitus without complications: Secondary | ICD-10-CM | POA: Diagnosis not present

## 2020-12-20 DIAGNOSIS — R059 Cough, unspecified: Secondary | ICD-10-CM | POA: Diagnosis not present

## 2020-12-20 DIAGNOSIS — Z20818 Contact with and (suspected) exposure to other bacterial communicable diseases: Secondary | ICD-10-CM | POA: Diagnosis not present

## 2020-12-20 DIAGNOSIS — R509 Fever, unspecified: Secondary | ICD-10-CM | POA: Diagnosis not present

## 2020-12-20 DIAGNOSIS — J40 Bronchitis, not specified as acute or chronic: Secondary | ICD-10-CM | POA: Diagnosis not present

## 2020-12-20 DIAGNOSIS — Z720 Tobacco use: Secondary | ICD-10-CM | POA: Diagnosis not present

## 2021-01-21 ENCOUNTER — Encounter: Payer: Self-pay | Admitting: Orthopedic Surgery

## 2021-01-21 ENCOUNTER — Ambulatory Visit (INDEPENDENT_AMBULATORY_CARE_PROVIDER_SITE_OTHER): Payer: BC Managed Care – PPO | Admitting: Orthopedic Surgery

## 2021-01-21 ENCOUNTER — Other Ambulatory Visit: Payer: Self-pay

## 2021-01-21 ENCOUNTER — Ambulatory Visit: Payer: Self-pay

## 2021-01-21 DIAGNOSIS — M25562 Pain in left knee: Secondary | ICD-10-CM | POA: Diagnosis not present

## 2021-01-21 DIAGNOSIS — G8929 Other chronic pain: Secondary | ICD-10-CM

## 2021-01-21 MED ORDER — HYDROCODONE-ACETAMINOPHEN 5-325 MG PO TABS: 1.0000 | ORAL_TABLET | ORAL | 0 refills | Status: DC | PRN

## 2021-01-21 NOTE — Progress Notes (Signed)
Office Visit Note   Patient: Travis White           Date of Birth: [birth year]           MRN: [id number] Visit Date: 01/21/2021              Requested by: No referring provider defined for this encounter. PCP: Patient, No Pcp Per  Chief Complaint  Patient presents with  . Right Knee - Pain  . Left Knee - Pain      HPI: Patient is a 60 year old gentleman who presents with worsening pain of the left knee.  Previously his pain was ongoing in the right knee.  Patient states he has been doing an excellent job with his glucose control and his A1c is now 6.0.  Patient states he is unable to perform activities of daily living due to his left knee pain.  Assessment & Plan: Visit Diagnoses:  1. Chronic pain of left knee     Plan: Will call in a prescription for Vicodin to help with the pain will post for a left total knee arthroplasty risks and benefits were discussed patient states he understands and wishes to proceed at this time.  Follow-Up Instructions: Return if symptoms worsen or fail to improve.   Ortho Exam  Patient is alert, oriented, no adenopathy, well-dressed, normal affect, normal respiratory effort. Examination patient has an antalgic gait with varus alignment of both knees.  He has range of motion from 10 to 90 degrees of the left knee crepitation with range of motion tenderness to palpation of the medial lateral joint line collaterals and cruciates are stable.  There is no redness no cellulitis no signs of infection.  Previous hemoglobin A1c was high.  Imaging: XR Knee 1-2 Views Left  Result Date: 01/21/2021 2 view radiographs of the left knee shows periarticular cyst in all 3 compartments with significant calcification of the medial and lateral meniscus.  Patient has a varus alignment of both knees.  No images are attached to the encounter.  Labs: Lab Results  Component Value Date   HGBA1C (H) 12/12/2010    8.7 (NOTE)                                                                        According to the ADA Clinical Practice Recommendations for 2011, when HbA1c is used as a screening test:   >=6.5%   Diagnostic of Diabetes Mellitus           (if abnormal result  is confirmed)  5.7-6.4%   Increased risk of developing Diabetes Mellitus  References:Diagnosis and Classification of Diabetes Mellitus,Diabetes Care,2011,34(Suppl 1):S62-S69 and Standards of Medical Care in         Diabetes - 2011,Diabetes Care,2011,34  (Suppl 1):S11-S61.     No results found for: ALBUMIN, PREALBUMIN, LABURIC  No results found for: MG No results found for: VD25OH  No results found for: PREALBUMIN CBC EXTENDED Latest Ref Rng & Units 12/13/2010 12/12/2010 02/23/2008  HGB 13.0 - 17.0 g/dL 11.0(L) 12.2(L) 15.2 POINT OF CARE RESULT  HCT 39.0 - 52.0 % 33.2(L) 35.5(L) -     There is no height or weight on file to calculate BMI.  Orders:  Orders Placed  This Encounter  Procedures  . XR Knee 1-2 Views Left   No orders of the defined types were placed in this encounter.    Procedures: No procedures performed  Clinical Data: No additional findings.  ROS:  All other systems negative, except as noted in the HPI. Review of Systems  Objective: Vital Signs: There were no vitals taken for this visit.  Specialty Comments:  No specialty comments available.  PMFS History: There are no problems to display for this patient.  Past Medical History:  Diagnosis Date  . Diabetes mellitus     History reviewed. No pertinent family history.  Past Surgical History:  Procedure Laterality Date  . Left elbow     Social History   Occupational History  . Not on file  Tobacco Use  . Smoking status: Current Every Day Smoker    Packs/day: 1.50    Types: Cigarettes  . Smokeless tobacco: Never Used  Substance and Sexual Activity  . Alcohol use: No  . Drug use: No  . Sexual activity: Not on file

## 2021-01-25 DIAGNOSIS — I10 Essential (primary) hypertension: Secondary | ICD-10-CM | POA: Diagnosis not present

## 2021-01-25 DIAGNOSIS — E1151 Type 2 diabetes mellitus with diabetic peripheral angiopathy without gangrene: Secondary | ICD-10-CM | POA: Diagnosis not present

## 2021-01-30 DIAGNOSIS — E1151 Type 2 diabetes mellitus with diabetic peripheral angiopathy without gangrene: Secondary | ICD-10-CM | POA: Diagnosis not present

## 2021-02-06 ENCOUNTER — Telehealth: Payer: Self-pay | Admitting: Orthopedic Surgery

## 2021-02-06 NOTE — Telephone Encounter (Signed)
Pt called stating his knee is in so much pain he is having to leave work. So the pt is wanting to know is Dr. Lajoyce Corners going to do the surgery? If not can he call something in for pain? Pt would like a CB  [phone]

## 2021-02-06 NOTE — Telephone Encounter (Signed)
Holding pending advisement on when pt can be scheduled. He just received a refill on 01/21/21 # 30 for [employer]

## 2021-02-07 ENCOUNTER — Other Ambulatory Visit: Payer: Self-pay | Admitting: Orthopedic Surgery

## 2021-02-07 MED ORDER — HYDROCODONE-ACETAMINOPHEN 5-325 MG PO TABS: 1.0000 | ORAL_TABLET | Freq: Four times a day (QID) | ORAL | 0 refills | Status: DC | PRN

## 2021-02-07 NOTE — Telephone Encounter (Signed)
What would you like me to do for this pt? Should I see if Dr. Lajoyce Corners will refill some type of pain medication or can he et scheduled soon for a total knee?

## 2021-02-07 NOTE — Telephone Encounter (Signed)
Looks like Mr. Howes is next on my list to schedule for a total joint.  Per the hospital and insurance company rules, it will be scheduled for a date that is at least 2-3 weeks from today.

## 2021-02-07 NOTE — Telephone Encounter (Signed)
Rx sent 

## 2021-02-07 NOTE — Telephone Encounter (Signed)
I called and lm on v to advise pt that refill sent to pharm and that he should use very sparingly as this will not work for post surgical pain management if he takes routinely. Also advised about surgical sch message below and that someone will be in contact with him to schedule.

## 2021-02-21 ENCOUNTER — Other Ambulatory Visit: Payer: Self-pay | Admitting: Physician Assistant

## 2021-02-22 NOTE — Progress Notes (Addendum)
PCP - Dr. Ivery Quale- request office records requested   Cardiologist -  none  Chest x-ray - na  EKG - 02/25/2021  Stress Test - no  ECHO - over 5 years ago- asked for report rom Dr Jarold Motto  Cardiac Cath - no  AICD-no PM-no LOOP-no  Sleep Study - no CPAP - no  LABS-CBC, BMP, PCR, Covid  ASA-no  ERAS-yes  HA1C-patient had one in PCP  Recently -/I request records  A1C was 7.3 on 01/25/21 Fasting Blood Sugar - 170 Checks Blood Sugar ___1__ times a day  Anesthesia-  Pt denies having chest pain, sob, or fever at this time. All instructions explained to the pt, with a verbal understanding of the material. Pt agrees to go over the instructions while at home for a better understanding. Pt also instructed to self quarantine after being tested for COVID-19. The opportunity to ask questions was provided.

## 2021-02-22 NOTE — Pre-Procedure Instructions (Addendum)
Travis White  02/22/2021      Your procedure is scheduled on Wednesday, April 20.  Report to [facility], Main Entrance or Entrance "A" at 6:30 AM.                Your surgery or procedure is scheduled to begin at 8:30 AM.   Call this number if you have problems the morning of surgery: [phone]  This is the number for the Pre- Surgical Desk.                For any other questions, please call [phone], Monday - Friday 8 AM - 4 PM.   Remember:  Do not eat after midnight, Tuesday, April 19.  You may drink clear liquids until 5:30 AM.  Clear liquids allowed are:                     Water, Juice (non-citric and without pulp - diabetics please choose diet or no sugar options), Carbonated beverages - (diabetics please choose diet or no sugar options), Clear Tea, Black Coffee only (no creamer, milk or cream including half and half), Plain Jell-O only (diabetics please choose diet or no sugar options), Gatorade (diabetics please choose diet or no sugar options) and Plain Popsicles only  Drink the 10 ounce bottle of water you were give in your APT appointment.   Take these medicines the morning of surgery with A SIP OF WATER: atorvastatin (LIPITOR)  Take if needed: acetaminophen (TYLENOL) or HYDROcodone-acetaminophen (NORCO/VICODIN)   STOP taking Aspirin, Aspirin Products (Goody Powder, Excedrin Migraine), Ibuprofen (Advil), Naproxen (Aleve), Vitamins and Herbal Products (ie Fish Oil).    WHAT DO I DO ABOUT MY DIABETES MEDICATION?   DO Not take  FARXIGA, Tuesday or Wednesday morning  . Do not take oral diabetes medicines (pills) the morning of surgery: metFORMIN (GLUCOPHAGE)   . THE MORNING OF SURGERY, take 32  units of Insulin Degludec (TRESIBA) insulin.  . The day of surgery, do not take other diabetes injectables, including Byetta (exenatide), Bydureon (exenatide ER), Victoza (liraglutide), or Trulicity (dulaglutide).   How to Manage Your Diabetes Before and  After Surgery  Why is it important to control my blood sugar before and after surgery? . Improving blood sugar levels before and after surgery helps healing and can limit problems. . A way of improving blood sugar control is eating a healthy diet by: o  Eating less sugar and carbohydrates o  Increasing activity/exercise o  Talking with your doctor about reaching your blood sugar goals . High blood sugars (greater than 180 mg/dL) can raise your risk of infections and slow your recovery, so you will need to focus on controlling your diabetes during the weeks before surgery. . Make sure that the doctor who takes care of your diabetes knows about your planned surgery including the date and location.  How do I manage my blood sugar before surgery? . Check your blood sugar at least 4 times a day, starting 2 days before surgery, to make sure that the level is not too high or low. o Check your blood sugar the morning of your surgery when you wake up and every 2 hours until you get to the Short Stay unit. . If your blood sugar is less than 70 mg/dL, you will need to treat for low blood sugar: o Do not take insulin. o Treat a low blood sugar (less than 70 mg/dL) with  cup of clear juice (  cranberry or apple), 4 glucose tablets, OR glucose gel. Recheck blood sugar in 15 minutes after treatment (to make sure it is greater than 70 mg/dL). If your blood sugar is not greater than 70 mg/dL on recheck, call [phone] o  for further instructions. . Report your blood sugar to the short stay nurse when you get to Short Stay.  . If you are admitted to the hospital after surgery: o Your blood sugar will be checked by the staff and you will probably be given insulin after surgery (instead of oral diabetes medicines) to make sure you have good blood sugar levels. o The goal for blood sugar control after surgery is 80-180 mg/dL.  Special instructions:  If you smoke DO NOT smoke within 24 hours of surgery. If you  use a CPAP- please bring the mask to the hospital with you.   [facility]- Preparing For Surgery  Before surgery, you can play an important role. Because skin is not sterile, your skin needs to be as free of germs as possible. You can reduce the number of germs on your skin by washing with CHG (chlorahexidine gluconate) Soap before surgery.  CHG is an antiseptic cleaner which kills germs and bonds with the skin to continue killing germs even after washing.    Oral Hygiene is also important to reduce your risk of infection.  Remember - BRUSH YOUR TEETH THE MORNING OF SURGERY WITH YOUR REGULAR TOOTHPASTE  Please do not use if you have an allergy to CHG or antibacterial soaps. If your skin becomes reddened/irritated stop using the CHG.  Do not shave (including legs and underarms) for at least 48 hours prior to first CHG shower. It is OK to shave your face.  Please follow these instructions carefully.   1. Shower the NIGHT BEFORE SURGERY and the MORNING OF SURGERY with CHG.   2. If you chose to wash your hair, wash your hair first as usual with your normal shampoo.  3. After you shampoo, wash your face and private area with the soap you use at home, then rinse your hair and body thoroughly to remove the shampoo and soap.  4. Use CHG as you would any other liquid soap. You can apply CHG directly to the skin and wash gently with a scrungie or a clean washcloth.   5. Apply the CHG Soap to your body ONLY FROM THE NECK DOWN.  Do not use on open wounds or open sores. Avoid contact with your eyes, ears, mouth and genitals (private parts).   6. Wash thoroughly, paying special attention to the area where your surgery will be performed.  7. Thoroughly rinse your body with warm water from the neck down.  8. DO NOT shower/wash with your normal soap after using and rinsing off the CHG Soap.  9. Pat yourself dry with a CLEAN TOWEL.  10. Wear CLEAN PAJAMAS to bed the night before surgery, wear  comfortable clothes the morning of surgery  11. Place CLEAN SHEETS on your bed the night of your first shower and DO NOT SLEEP WITH PETS.  Day of Surgery: Shower as instructed above. Do not apply any deodorants/lotions, powders or colognes.  Please wear clean clothes to the hospital/surgery center.   Remember to brush your teeth WITH YOUR REGULAR TOOTHPASTE.  Do not wear jewelry, make-up or nail polish.  Do not shave 48 hours prior to surgery.  Men may shave face and neck.  Do not bring valuables to the hospital.  [facility]  is not responsible for any belongings or valuables.  Contacts, dentures or bridgework may not be worn into surgery.  Leave your suitcase in the car.  After surgery it may be brought to your room.  For patients admitted to the hospital, discharge time will be determined by your treatment team.  Patients discharged the day of surgery will not be allowed to drive home.   Please read over the fact sheets that you were given.

## 2021-02-25 ENCOUNTER — Encounter (HOSPITAL_COMMUNITY): Payer: Self-pay

## 2021-02-25 ENCOUNTER — Other Ambulatory Visit: Payer: Self-pay

## 2021-02-25 ENCOUNTER — Telehealth: Payer: Self-pay | Admitting: Orthopedic Surgery

## 2021-02-25 ENCOUNTER — Encounter (HOSPITAL_COMMUNITY)
Admission: RE | Admit: 2021-02-25 | Discharge: 2021-02-25 | Disposition: A | Discharge status: Home / Self Care | Payer: BC Managed Care – PPO | Source: Ambulatory Visit | Attending: Orthopedic Surgery | Admitting: Orthopedic Surgery

## 2021-02-25 DIAGNOSIS — Z20822 Contact with and (suspected) exposure to covid-19: Secondary | ICD-10-CM | POA: Diagnosis not present

## 2021-02-25 DIAGNOSIS — Z01818 Encounter for other preprocedural examination: Secondary | ICD-10-CM | POA: Insufficient documentation

## 2021-02-25 HISTORY — DX: Unspecified osteoarthritis, unspecified site: M19.90

## 2021-02-25 LAB — CBC
HCT: 45.6 % (ref 39.0–52.0)
Hemoglobin: 15.9 g/dL (ref 13.0–17.0)
MCH: 31.1 pg (ref 26.0–34.0)
MCHC: 34.9 g/dL (ref 30.0–36.0)
MCV: 89.1 fL (ref 80.0–100.0)
Platelets: 249 10*3/uL (ref 150–400)
RBC: 5.12 MIL/uL (ref 4.22–5.81)
RDW: 13.5 % (ref 11.5–15.5)
WBC: 11.4 10*3/uL — ABNORMAL HIGH (ref 4.0–10.5)
nRBC: 0 % (ref 0.0–0.2)

## 2021-02-25 LAB — BASIC METABOLIC PANEL
Anion gap: 9 (ref 5–15)
BUN: 16 mg/dL (ref 6–20)
CO2: 24 mmol/L (ref 22–32)
Calcium: 10.1 mg/dL (ref 8.9–10.3)
Chloride: 103 mmol/L (ref 98–111)
Creatinine, Ser: 0.81 mg/dL (ref 0.61–1.24)
GFR, Estimated: >60 mL/min (ref >60)
Glucose, Bld: 131 mg/dL — ABNORMAL HIGH (ref 70–99)
Potassium: 4.1 mmol/L (ref 3.5–5.1)
Sodium: 136 mmol/L (ref 135–145)

## 2021-02-25 LAB — SARS CORONAVIRUS 2 (TAT 6-24 HRS): SARS Coronavirus 2: NEGATIVE

## 2021-02-25 LAB — SURGICAL PCR SCREEN
MRSA, PCR: NEGATIVE
Staphylococcus aureus: POSITIVE — AB

## 2021-02-25 LAB — GLUCOSE, CAPILLARY: Glucose-Capillary: 151 mg/dL — ABNORMAL HIGH (ref 70–99)

## 2021-02-25 NOTE — Telephone Encounter (Signed)
Patient submitted medical release form, short term disability, and $25.00 cash payment to [employer],. Accepted 02/25/21

## 2021-02-26 ENCOUNTER — Other Ambulatory Visit: Payer: Self-pay

## 2021-02-26 NOTE — Anesthesia Preprocedure Evaluation (Addendum)
Anesthesia Evaluation  Patient identified by MRN, date of birth, ID band Patient awake    Reviewed: Allergy & Precautions, NPO status , Patient's Chart, lab work & pertinent test results  Airway Mallampati: II  TM Distance: >3 FB Neck ROM: Full    Dental  (+) Upper Dentures, Lower Dentures   Pulmonary neg pulmonary ROS, Current Smoker,    Pulmonary exam normal        Cardiovascular hypertension, Pt. on medications  Rhythm:Regular Rate:Normal     Neuro/Psych negative neurological ROS  negative psych ROS   GI/Hepatic negative GI ROS, Neg liver ROS,   Endo/Other  diabetes, Type 2, Oral Hypoglycemic Agents, Insulin Dependent  Renal/GU negative Renal ROS  negative genitourinary   Musculoskeletal  (+) Arthritis , Osteoarthritis,    Abdominal (+)  Abdomen: soft. Bowel sounds: normal.  Peds  Hematology negative hematology ROS (+)   Anesthesia Other Findings   Reproductive/Obstetrics                            Anesthesia Physical Anesthesia Plan  ASA: II  Anesthesia Plan: MAC, Regional and Spinal   Post-op Pain Management:  Regional for Post-op pain   Induction: Intravenous  PONV Risk Score and Plan: Ondansetron, Dexamethasone, Propofol infusion, Midazolam and Treatment may vary due to age or medical condition  Airway Management Planned: Simple Face Mask, Natural Airway and Nasal Cannula  Additional Equipment: None  Intra-op Plan:   Post-operative Plan: Extubation in OR  Informed Consent: I have reviewed the patients History and Physical, chart, labs and discussed the procedure including the risks, benefits and alternatives for the proposed anesthesia with the patient or authorized representative who has indicated his/her understanding and acceptance.     Dental advisory given  Plan Discussed with: CRNA  Anesthesia Plan Comments: (Lab Results      Component                Value                Date                      WBC                      11.4 (H)            02/25/2021                HGB                      15.9                02/25/2021                HCT                      45.6                02/25/2021                MCV                      89.1                02/25/2021                PLT  249                 02/25/2021           Lab Results      Component                Value               Date                      NA                       136                 02/25/2021                K                        4.1                 02/25/2021                CO2                      24                  02/25/2021                GLUCOSE                  131 (H)             02/25/2021                BUN                      16                  02/25/2021                CREATININE               0.81                02/25/2021                CALCIUM                  10.1                02/25/2021                GFRNONAA                 >60                 02/25/2021          )       Anesthesia Quick Evaluation

## 2021-02-27 ENCOUNTER — Ambulatory Visit (HOSPITAL_COMMUNITY): Payer: BC Managed Care – PPO | Admitting: Physician Assistant

## 2021-02-27 ENCOUNTER — Observation Stay (HOSPITAL_COMMUNITY)
Admission: RE | Admit: 2021-02-27 | Discharge: 2021-02-28 | Disposition: A | Discharge status: Home / Self Care | Payer: BC Managed Care – PPO | Attending: Orthopedic Surgery | Admitting: Orthopedic Surgery

## 2021-02-27 ENCOUNTER — Encounter (HOSPITAL_COMMUNITY): Payer: Self-pay | Admitting: Orthopedic Surgery

## 2021-02-27 ENCOUNTER — Ambulatory Visit (HOSPITAL_COMMUNITY): Payer: BC Managed Care – PPO | Admitting: Anesthesiology

## 2021-02-27 ENCOUNTER — Encounter (HOSPITAL_COMMUNITY)
Admission: RE | Disposition: A | Discharge status: Home / Self Care | Payer: Self-pay | Source: Home / Self Care | Attending: Orthopedic Surgery

## 2021-02-27 ENCOUNTER — Other Ambulatory Visit: Payer: Self-pay

## 2021-02-27 DIAGNOSIS — F17210 Nicotine dependence, cigarettes, uncomplicated: Secondary | ICD-10-CM | POA: Insufficient documentation

## 2021-02-27 DIAGNOSIS — I10 Essential (primary) hypertension: Secondary | ICD-10-CM | POA: Diagnosis not present

## 2021-02-27 DIAGNOSIS — M1712 Unilateral primary osteoarthritis, left knee: Principal | ICD-10-CM

## 2021-02-27 DIAGNOSIS — G8918 Other acute postprocedural pain: Secondary | ICD-10-CM | POA: Diagnosis not present

## 2021-02-27 DIAGNOSIS — Z794 Long term (current) use of insulin: Secondary | ICD-10-CM | POA: Diagnosis not present

## 2021-02-27 DIAGNOSIS — E119 Type 2 diabetes mellitus without complications: Secondary | ICD-10-CM | POA: Insufficient documentation

## 2021-02-27 DIAGNOSIS — Z23 Encounter for immunization: Secondary | ICD-10-CM | POA: Insufficient documentation

## 2021-02-27 HISTORY — PX: TOTAL KNEE ARTHROPLASTY: SHX125

## 2021-02-27 LAB — GLUCOSE, CAPILLARY
Glucose-Capillary: 185 mg/dL — ABNORMAL HIGH (ref 70–99)
Glucose-Capillary: 159 mg/dL — ABNORMAL HIGH (ref 70–99)
Glucose-Capillary: 365 mg/dL — ABNORMAL HIGH (ref 70–99)
Glucose-Capillary: 301 mg/dL — ABNORMAL HIGH (ref 70–99)

## 2021-02-27 LAB — HEMOGLOBIN A1C
Hgb A1c MFr Bld: 7.4 % — ABNORMAL HIGH (ref 4.8–5.6)
Mean Plasma Glucose: 165.68 mg/dL

## 2021-02-27 SURGERY — ARTHROPLASTY, KNEE, TOTAL
Anesthesia: Monitor Anesthesia Care | Site: Knee | Laterality: Left

## 2021-02-27 MED ORDER — INSULIN ASPART 100 UNIT/ML ~~LOC~~ SOLN
0.0000 [IU] | Freq: Three times a day (TID) | SUBCUTANEOUS | Status: DC
  Administered 2021-02-27: 15 [IU] via SUBCUTANEOUS
  Administered 2021-02-28: 8 [IU] via SUBCUTANEOUS
  Administered 2021-02-28: 5 [IU] via SUBCUTANEOUS

## 2021-02-27 MED ORDER — LOSARTAN POTASSIUM 50 MG PO TABS
100.0000 mg | ORAL_TABLET | Freq: Every day | ORAL | Status: DC
  Administered 2021-02-27: 100 mg via ORAL
  Filled 2021-02-27 (×2): qty 2

## 2021-02-27 MED ORDER — CHLORHEXIDINE GLUCONATE 0.12 % MT SOLN
15.0000 mL | Freq: Once | OROMUCOSAL | Status: AC
  Administered 2021-02-27: 15 mL via OROMUCOSAL
  Filled 2021-02-27: qty 15

## 2021-02-27 MED ORDER — DAPAGLIFLOZIN PROPANEDIOL 5 MG PO TABS
5.0000 mg | ORAL_TABLET | Freq: Every morning | ORAL | Status: DC
  Administered 2021-02-28: 5 mg via ORAL
  Filled 2021-02-27: qty 1

## 2021-02-27 MED ORDER — ONDANSETRON HCL 4 MG/2ML IJ SOLN: 4.0000 mg | Freq: Four times a day (QID) | INTRAMUSCULAR | Status: DC | PRN

## 2021-02-27 MED ORDER — PNEUMOCOCCAL VAC POLYVALENT 25 MCG/0.5ML IJ INJ
0.5000 mL | INJECTION | INTRAMUSCULAR | Status: AC
  Administered 2021-02-28: 0.5 mL via INTRAMUSCULAR
  Filled 2021-02-27: qty 0.5

## 2021-02-27 MED ORDER — CEFAZOLIN SODIUM-DEXTROSE 2-4 GM/100ML-% IV SOLN
2.0000 g | INTRAVENOUS | Status: AC
  Administered 2021-02-27: 2 g via INTRAVENOUS
  Filled 2021-02-27: qty 100

## 2021-02-27 MED ORDER — PHENYLEPHRINE HCL-NACL 10-0.9 MG/250ML-% IV SOLN
INTRAVENOUS | Status: DC | PRN
  Administered 2021-02-27: 30 ug/min via INTRAVENOUS

## 2021-02-27 MED ORDER — BUPIVACAINE IN DEXTROSE 0.75-8.25 % IT SOLN
INTRATHECAL | Status: DC | PRN
  Administered 2021-02-27: 1.6 mL via INTRATHECAL

## 2021-02-27 MED ORDER — METOCLOPRAMIDE HCL 5 MG/ML IJ SOLN: 5.0000 mg | Freq: Three times a day (TID) | INTRAMUSCULAR | Status: DC | PRN

## 2021-02-27 MED ORDER — DEXAMETHASONE SODIUM PHOSPHATE 10 MG/ML IJ SOLN
INTRAMUSCULAR | Status: DC | PRN
  Administered 2021-02-27: 5 mg via INTRAVENOUS

## 2021-02-27 MED ORDER — PROPOFOL 10 MG/ML IV BOLUS
INTRAVENOUS | Status: DC | PRN
  Administered 2021-02-27: 20 mg via INTRAVENOUS

## 2021-02-27 MED ORDER — HYDROMORPHONE HCL 1 MG/ML IJ SOLN
0.5000 mg | INTRAMUSCULAR | Status: DC | PRN
  Administered 2021-02-28: 1 mg via INTRAVENOUS
  Filled 2021-02-27: qty 1

## 2021-02-27 MED ORDER — ATORVASTATIN CALCIUM 40 MG PO TABS
40.0000 mg | ORAL_TABLET | Freq: Every day | ORAL | Status: DC
  Administered 2021-02-27: 40 mg via ORAL
  Filled 2021-02-27 (×2): qty 1

## 2021-02-27 MED ORDER — LACTATED RINGERS IV SOLN: INTRAVENOUS | Status: DC

## 2021-02-27 MED ORDER — DULAGLUTIDE 3 MG/0.5ML ~~LOC~~ SOAJ: 3.0000 mg | SUBCUTANEOUS | Status: DC

## 2021-02-27 MED ORDER — TRANEXAMIC ACID-NACL 1000-0.7 MG/100ML-% IV SOLN
1000.0000 mg | Freq: Once | INTRAVENOUS | Status: AC
  Administered 2021-02-27: 1000 mg via INTRAVENOUS
  Filled 2021-02-27 (×2): qty 100

## 2021-02-27 MED ORDER — OXYCODONE HCL 5 MG PO TABS
5.0000 mg | ORAL_TABLET | ORAL | Status: DC | PRN
  Administered 2021-02-28: 10 mg via ORAL
  Filled 2021-02-27: qty 2

## 2021-02-27 MED ORDER — PHENOL 1.4 % MT LIQD: 1.0000 | OROMUCOSAL | Status: DC | PRN

## 2021-02-27 MED ORDER — FENTANYL CITRATE (PF) 250 MCG/5ML IJ SOLN
INTRAMUSCULAR | Status: AC
  Filled 2021-02-27: qty 5

## 2021-02-27 MED ORDER — 0.9 % SODIUM CHLORIDE (POUR BTL) OPTIME
TOPICAL | Status: DC | PRN
  Administered 2021-02-27: 1000 mL

## 2021-02-27 MED ORDER — INSULIN ASPART 100 UNIT/ML ~~LOC~~ SOLN
10.0000 [IU] | Freq: Two times a day (BID) | SUBCUTANEOUS | Status: DC
  Administered 2021-02-27 – 2021-02-28 (×2): 10 [IU] via SUBCUTANEOUS

## 2021-02-27 MED ORDER — ONDANSETRON HCL 4 MG PO TABS: 4.0000 mg | ORAL_TABLET | Freq: Four times a day (QID) | ORAL | Status: DC | PRN

## 2021-02-27 MED ORDER — OXYCODONE HCL 5 MG PO TABS
10.0000 mg | ORAL_TABLET | ORAL | Status: DC | PRN
  Administered 2021-02-27 – 2021-02-28 (×4): 10 mg via ORAL
  Filled 2021-02-27 (×4): qty 2

## 2021-02-27 MED ORDER — ORAL CARE MOUTH RINSE: 15.0000 mL | Freq: Once | OROMUCOSAL | Status: AC

## 2021-02-27 MED ORDER — ASPIRIN EC 325 MG PO TBEC
325.0000 mg | DELAYED_RELEASE_TABLET | Freq: Every day | ORAL | Status: DC
  Administered 2021-02-28: 325 mg via ORAL
  Filled 2021-02-27: qty 1

## 2021-02-27 MED ORDER — CEFAZOLIN SODIUM-DEXTROSE 2-4 GM/100ML-% IV SOLN
2.0000 g | Freq: Four times a day (QID) | INTRAVENOUS | Status: AC
  Administered 2021-02-27 (×2): 2 g via INTRAVENOUS
  Filled 2021-02-27 (×2): qty 100

## 2021-02-27 MED ORDER — DOCUSATE SODIUM 100 MG PO CAPS
100.0000 mg | ORAL_CAPSULE | Freq: Two times a day (BID) | ORAL | Status: DC
  Administered 2021-02-28: 100 mg via ORAL
  Filled 2021-02-27 (×2): qty 1

## 2021-02-27 MED ORDER — METFORMIN HCL 500 MG PO TABS
1000.0000 mg | ORAL_TABLET | Freq: Two times a day (BID) | ORAL | Status: DC
  Administered 2021-02-27 – 2021-02-28 (×2): 1000 mg via ORAL
  Filled 2021-02-27 (×2): qty 2

## 2021-02-27 MED ORDER — FENTANYL CITRATE (PF) 250 MCG/5ML IJ SOLN
INTRAMUSCULAR | Status: DC | PRN
  Administered 2021-02-27 (×2): 50 ug via INTRAVENOUS

## 2021-02-27 MED ORDER — SODIUM CHLORIDE 0.9 % IR SOLN
Status: DC | PRN
  Administered 2021-02-27: 3000 mL

## 2021-02-27 MED ORDER — ACETAMINOPHEN 325 MG PO TABS
325.0000 mg | ORAL_TABLET | Freq: Four times a day (QID) | ORAL | Status: DC | PRN
Start: 2021-02-28 — End: 2021-02-28

## 2021-02-27 MED ORDER — SODIUM CHLORIDE 0.9 % IV SOLN: INTRAVENOUS | Status: DC

## 2021-02-27 MED ORDER — MIDAZOLAM HCL 2 MG/2ML IJ SOLN
INTRAMUSCULAR | Status: DC | PRN
  Administered 2021-02-27: 2 mg via INTRAVENOUS

## 2021-02-27 MED ORDER — TRANEXAMIC ACID-NACL 1000-0.7 MG/100ML-% IV SOLN
INTRAVENOUS | Status: AC
  Filled 2021-02-27: qty 100

## 2021-02-27 MED ORDER — PROPOFOL 500 MG/50ML IV EMUL
INTRAVENOUS | Status: DC | PRN
  Administered 2021-02-27: 75 ug/kg/min via INTRAVENOUS

## 2021-02-27 MED ORDER — ONDANSETRON HCL 4 MG/2ML IJ SOLN
INTRAMUSCULAR | Status: DC | PRN
  Administered 2021-02-27: 4 mg via INTRAVENOUS

## 2021-02-27 MED ORDER — KETOROLAC TROMETHAMINE 15 MG/ML IJ SOLN
15.0000 mg | Freq: Four times a day (QID) | INTRAMUSCULAR | Status: AC
  Administered 2021-02-27 – 2021-02-28 (×4): 15 mg via INTRAVENOUS
  Filled 2021-02-27 (×4): qty 1

## 2021-02-27 MED ORDER — CHLORTHALIDONE 25 MG PO TABS
25.0000 mg | ORAL_TABLET | Freq: Every day | ORAL | Status: DC
  Administered 2021-02-27 – 2021-02-28 (×2): 25 mg via ORAL
  Filled 2021-02-27 (×2): qty 1

## 2021-02-27 MED ORDER — TRANEXAMIC ACID 1000 MG/10ML IV SOLN
2000.0000 mg | Freq: Once | INTRAVENOUS | Status: AC
  Administered 2021-02-27: 2000 mg via TOPICAL
  Filled 2021-02-27: qty 20

## 2021-02-27 MED ORDER — PROPOFOL 10 MG/ML IV BOLUS
INTRAVENOUS | Status: AC
  Filled 2021-02-27: qty 40

## 2021-02-27 MED ORDER — TRANEXAMIC ACID-NACL 1000-0.7 MG/100ML-% IV SOLN
INTRAVENOUS | Status: DC | PRN
  Administered 2021-02-27: 1000 mg via INTRAVENOUS

## 2021-02-27 MED ORDER — MIDAZOLAM HCL 2 MG/2ML IJ SOLN
INTRAMUSCULAR | Status: AC
  Filled 2021-02-27: qty 2

## 2021-02-27 MED ORDER — MENTHOL 3 MG MT LOZG: 1.0000 | LOZENGE | OROMUCOSAL | Status: DC | PRN

## 2021-02-27 MED ORDER — METOCLOPRAMIDE HCL 5 MG PO TABS
5.0000 mg | ORAL_TABLET | Freq: Three times a day (TID) | ORAL | Status: DC | PRN
Start: 2021-02-27 — End: 2021-02-28
  Filled 2021-02-27: qty 1

## 2021-02-27 SURGICAL SUPPLY — 54 items
ARTISURF 10M VEL 10-11 EF KNEE (Knees) ×2 IMPLANT
BLADE SAGITTAL 25.0X1.19X90 (BLADE) ×2 IMPLANT
BLADE SAW SGTL 13X75X1.27 (BLADE) ×2 IMPLANT
BLADE SURG 21 STRL SS (BLADE) ×4 IMPLANT
BNDG COHESIVE 6X5 TAN STRL LF (GAUZE/BANDAGES/DRESSINGS) ×4 IMPLANT
BNDG GAUZE ELAST 4 BULKY (GAUZE/BANDAGES/DRESSINGS) ×2 IMPLANT
BOWL SMART MIX CTS (DISPOSABLE) ×2 IMPLANT
COMP FEM LT PS SZ 10 (Miscellaneous) ×2 IMPLANT
COMP PATELLAR 10X35 METAL (Joint) ×2 IMPLANT
COMPONENT FEM LT PS SZ 10 (Miscellaneous) ×1 IMPLANT
COMPONENT PATELLAR 10X35 METAL (Joint) ×1 IMPLANT
COOLER ICEMAN CLASSIC (MISCELLANEOUS) ×2 IMPLANT
COVER SURGICAL LIGHT HANDLE (MISCELLANEOUS) ×2 IMPLANT
COVER WAND RF STERILE (DRAPES) ×2 IMPLANT
CUFF TOURN SGL QUICK 34 (TOURNIQUET CUFF) ×2
CUFF TOURN SGL QUICK 42 (TOURNIQUET CUFF) IMPLANT
CUFF TRNQT CYL 34X4.125X (TOURNIQUET CUFF) ×1 IMPLANT
DRAPE EXTREMITY T 121X128X90 (DISPOSABLE) ×2 IMPLANT
DRAPE HALF SHEET 40X57 (DRAPES) ×4 IMPLANT
DRAPE U-SHAPE 47X51 STRL (DRAPES) ×2 IMPLANT
DRSG ADAPTIC 3X8 NADH LF (GAUZE/BANDAGES/DRESSINGS) ×2 IMPLANT
DRSG PAD ABDOMINAL 8X10 ST (GAUZE/BANDAGES/DRESSINGS) ×2 IMPLANT
DURAPREP 26ML APPLICATOR (WOUND CARE) ×2 IMPLANT
ELECT REM PT RETURN 9FT ADLT (ELECTROSURGICAL) ×2
ELECTRODE REM PT RTRN 9FT ADLT (ELECTROSURGICAL) ×1 IMPLANT
FACESHIELD WRAPAROUND (MASK) ×2 IMPLANT
GAUZE SPONGE 4X4 12PLY STRL (GAUZE/BANDAGES/DRESSINGS) ×2 IMPLANT
GLOVE BIOGEL PI IND STRL 9 (GLOVE) ×1 IMPLANT
GLOVE BIOGEL PI INDICATOR 9 (GLOVE) ×1
GLOVE SURG ORTHO 9.0 STRL STRW (GLOVE) ×2 IMPLANT
GOWN STRL REUS W/ TWL XL LVL3 (GOWN DISPOSABLE) ×2 IMPLANT
GOWN STRL REUS W/TWL XL LVL3 (GOWN DISPOSABLE) ×4
HANDPIECE INTERPULSE COAX TIP (DISPOSABLE) ×2
HDLS TROCR DRIL PIN KNEE 75 (PIN) ×2
KIT BASIN OR (CUSTOM PROCEDURE TRAY) ×2 IMPLANT
KIT TURNOVER KIT B (KITS) ×2 IMPLANT
MANIFOLD NEPTUNE II (INSTRUMENTS) ×2 IMPLANT
NS IRRIG 1000ML POUR BTL (IV SOLUTION) ×2 IMPLANT
PACK TOTAL JOINT (CUSTOM PROCEDURE TRAY) ×2 IMPLANT
PAD ARMBOARD 7.5X6 YLW CONV (MISCELLANEOUS) ×2 IMPLANT
PAD COLD SHLDR WRAP-ON (PAD) ×2 IMPLANT
PIN DRILL HDLS TROCAR 75 4PK (PIN) ×1 IMPLANT
SCREW FEMALE HEX FIX 25X2.5 (ORTHOPEDIC DISPOSABLE SUPPLIES) ×2 IMPLANT
SET HNDPC FAN SPRY TIP SCT (DISPOSABLE) ×1 IMPLANT
STAPLER VISISTAT 35W (STAPLE) ×2 IMPLANT
STEM TIBIAL TRAB SZF LT (Stem) ×2 IMPLANT
SUCTION FRAZIER HANDLE 10FR (MISCELLANEOUS)
SUCTION TUBE FRAZIER 10FR DISP (MISCELLANEOUS) IMPLANT
SUT VIC AB 0 CT1 27 (SUTURE) ×2
SUT VIC AB 0 CT1 27XBRD ANBCTR (SUTURE) ×1 IMPLANT
SUT VIC AB 1 CTX 36 (SUTURE)
SUT VIC AB 1 CTX36XBRD ANBCTR (SUTURE) IMPLANT
TOWEL GREEN STERILE (TOWEL DISPOSABLE) ×2 IMPLANT
TOWEL GREEN STERILE FF (TOWEL DISPOSABLE) ×2 IMPLANT

## 2021-02-27 NOTE — Op Note (Signed)
DATE OF SURGERY:  02/27/2021  TIME: 10:22 AM  PATIENT NAME:  Travis White    AGE: 60 y.o.    PRE-OPERATIVE DIAGNOSIS:  Osteoarthritis Left Knee  POST-OPERATIVE DIAGNOSIS:  Osteoarthritis Left Knee  PROCEDURE:  Procedure(s): LEFT TOTAL KNEE ARTHROPLASTY  SURGEON: Aldean Baker    OPERATIVE IMPLANTS: Depuy , Posterior Stabilized.  Femur size 10, Tibia size F, Patella size 35 1-peg oval button, with a 10 mm polyethylene insert.  @ENCIMAGES @       PREOPERATIVE INDICATIONS:   [name] is a 60 y.o. year old male with end stage degenerative arthritis of the knee who failed conservative treatment and elected for Total Knee Arthroplasty.   The risks, benefits, and alternatives were discussed at length including but not limited to the risks of infection, bleeding, nerve injury, stiffness, blood clots, the need for revision surgery, cardiopulmonary complications, among others, and they were willing to proceed.  OPERATIVE DESCRIPTION:  The patient was brought to the operative room and placed in a supine position.  Spinall anesthesia was administered.  IV antibiotics were given.  The lower extremity was prepped and draped in the usual sterile fashion.  67 was used to cover all exposed skin. Time out was performed.    Anterior quadriceps tendon splitting approach was performed.  The patella was everted and osteophytes were removed.  The anterior horn of the medial and lateral meniscus was removed.   The distal femur was opened with the drill and the intramedullary distal femoral cutting jig was utilized, set at 5 degrees valgus resecting 9 mm off the distal femur.  Care was taken to protect the collateral ligaments.  Then the extramedullary tibial cutting jig was utilized set for 3 degree posterior slope.  Care was taken during the cut to protect the medial and collateral ligaments.  The proximal tibia was removed along with the posterior horns of the menisci.  The PCL was  sacrificed.    The extensor gap was measured and was approximately 10 mm.    The distal femoral sizing jig was applied, taking care to avoid notching.  Then the 4-in-1 cutting jig was applied and the anterior and posterior femur was cut, along with the chamfer cuts.  All posterior osteophytes were removed.  The flexion gap was then measured and was symmetric with the extension gap.  The distal femoral preparation using the appropriate jig to prepare the box.  The patella was then measured, and cut with the saw.    The proximal tibia sized and prepared accordingly with the reamer and the punch, and then all components were trialed with the poly insert.  The knee was found to have stable balance and full motion.  The knee was irrigated with normal saline and the knee was soaked with TXA.  The above named components were then press fit into place.    The knee was then taken through a range of motion and the patella tracked well and the knee irrigated copiously and the parapatellar and subcutaneous tissue closed with vicryl, and skin closed with staples..  A sterile Arthroform Prevena VAC dressing was applied and patient  was taken to the PACU in stable  condition.  There were no complications.  Total tourniquet time was 45 minutes.

## 2021-02-27 NOTE — Evaluation (Signed)
Physical Therapy Evaluation Patient Details Name: Travis White MRN: [id number] DOB: 1961/08/31 Today's Date: 02/27/2021   History of Present Illness  Pt is a 60 y/o male s/p L TKA. PMH includes DM.  Clinical Impression  Pt is s/p surgery above with deficits below. Mobility limited to chair this session secondary to pain. Requiring min guard A for transfers using RW. Educated about knee precautions. Will continue to follow acutely.     Follow Up Recommendations Follow surgeon's recommendation for DC plan and follow-up therapies    Equipment Recommendations  3in1 (PT)    Recommendations for Other Services       Precautions / Restrictions Precautions Precautions: Knee Precaution Booklet Issued: No Precaution Comments: Verbally reviewed knee precautions with pt. Restrictions Weight Bearing Restrictions: Yes LLE Weight Bearing: Weight bearing as tolerated      Mobility  Bed Mobility Overal bed mobility: Needs Assistance Bed Mobility: Supine to Sit     Supine to sit: Supervision     General bed mobility comments: Supervision for safety. Increased time secondary to pain.    Transfers Overall transfer level: Needs assistance Equipment used: Rolling walker (2 wheeled) Transfers: Sit to/from [employer] Sit to Stand: Min guard Stand pivot transfers: Min guard       General transfer comment: Min guard for safety to stand and transfer to chair. Cues for sequencing using RW. Further mobility limited secondary to pain.  Ambulation/Gait                Stairs            Wheelchair Mobility    Modified Rankin (Stroke Patients Only)       Balance Overall balance assessment: Needs assistance Sitting-balance support: No upper extremity supported;Feet supported Sitting balance-Leahy Scale: Fair     Standing balance support: Bilateral upper extremity supported;During functional activity Standing balance-Leahy Scale: Poor Standing balance  comment: Reliant on BUE support                             Pertinent Vitals/Pain Pain Assessment: 0-10 Pain Score: 8  Pain Location: L knee Pain Descriptors / Indicators: Aching;Operative site guarding Pain Intervention(s): Limited activity within patient's tolerance;Monitored during session;Repositioned    Home Living Family/patient expects to be discharged to:: Private residence Living Arrangements: Spouse/significant other Available Help at Discharge: Family Type of Home: House Home Access: Stairs to enter Entrance Stairs-Rails: Doctor, general practice of Steps: 5 Home Layout: One level Home Equipment: Cane - single point;Walker - 2 wheels      Prior Function Level of Independence: Independent               Hand Dominance        Extremity/Trunk Assessment   Upper Extremity Assessment Upper Extremity Assessment: Overall WFL for tasks assessed    Lower Extremity Assessment Lower Extremity Assessment: LLE deficits/detail LLE Deficits / Details: Deficits consistent with post op pain and weakness.    Cervical / Trunk Assessment Cervical / Trunk Assessment: Normal  Communication   Communication: No difficulties  Cognition Arousal/Alertness: Awake/alert Behavior During Therapy: WFL for tasks assessed/performed Overall Cognitive Status: Within Functional Limits for tasks assessed                                        General Comments General comments (skin integrity, edema, etc.): Pt's wife  present during session.    Exercises     Assessment/Plan    PT Assessment Patient needs continued PT services  PT Problem List Decreased range of motion;Decreased strength;Decreased activity tolerance;Decreased balance;Decreased mobility;Decreased knowledge of use of DME;Decreased knowledge of precautions;Pain       PT Treatment Interventions DME instruction;Gait training;Stair training;Therapeutic activities;Functional  mobility training;Therapeutic exercise;Balance training;Patient/family education    PT Goals (Current goals can be found in the Care Plan section)  Acute Rehab PT Goals Patient Stated Goal: to go home PT Goal Formulation: With patient Time For Goal Achievement: 03/13/21 Potential to Achieve Goals: Good    Frequency 7X/week   Barriers to discharge        Co-evaluation               AM-PAC PT "6 Clicks" Mobility  Outcome Measure Help needed turning from your back to your side while in a flat bed without using bedrails?: None Help needed moving from lying on your back to sitting on the side of a flat bed without using bedrails?: A Little Help needed moving to and from a bed to a chair (including a wheelchair)?: A Little Help needed standing up from a chair using your arms (e.g., wheelchair or bedside chair)?: A Little Help needed to walk in hospital room?: A Little Help needed climbing 3-5 steps with a railing? : A Lot 6 Click Score: 18    End of Session Equipment Utilized During Treatment: Gait belt Activity Tolerance: Patient limited by pain Patient left: in chair;with call bell/phone within reach;with family/visitor present Nurse Communication: Mobility status PT Visit Diagnosis: Other abnormalities of gait and mobility (R26.89);Pain Pain - Right/Left: Left Pain - part of body: Knee    Time: 6433-2951 PT Time Calculation (min) (ACUTE ONLY): 17 min   Charges:   PT Evaluation $PT Eval Low Complexity: 1 Low          Cindee Salt, DPT  Acute Rehabilitation Services  Pager: [phone] Office: [phone]   [name] 02/27/2021, 5:23 PM

## 2021-02-27 NOTE — Progress Notes (Signed)
1345 Received pt from PACU, Alert and oriented x4. Left knee incision with incisional wound vac on, no drainage. Ice machine in place and on. Numbness to LLE noted upon arrival to the room.

## 2021-02-27 NOTE — Interval H&P Note (Signed)
History and Physical Interval Note:  02/27/2021 6:47 AM  Travis White  has presented today for surgery, with the diagnosis of Osteoarthritis Left Knee.  The various methods of treatment have been discussed with the patient and family. After consideration of risks, benefits and other options for treatment, the patient has consented to  Procedure(s): LEFT TOTAL KNEE ARTHROPLASTY (Left) as a surgical intervention.  The patient's history has been reviewed, patient examined, no change in status, stable for surgery.  I have reviewed the patient's chart and labs.  Questions were answered to the patient's satisfaction.     Nadara Mustard

## 2021-02-27 NOTE — Anesthesia Procedure Notes (Signed)
Procedure Name: MAC Date/Time: 02/27/2021 8:44 AM Performed by: Darletta Moll, CRNA Pre-anesthesia Checklist: Patient identified, Emergency Drugs available, Suction available and Patient being monitored Patient Re-evaluated:Patient Re-evaluated prior to induction Oxygen Delivery Method: Simple face mask

## 2021-02-27 NOTE — Discharge Instructions (Signed)
INSTRUCTIONS AFTER JOINT REPLACEMENT   o Remove items at home which could result in a fall. This includes throw rugs or furniture in walking pathways o ICE to the affected joint every three hours while awake for 30 minutes at a time, for at least the first 3-5 days, and then as needed for pain and swelling.  Continue to use ice for pain and swelling. You may notice swelling that will progress down to the foot and ankle.  This is normal after surgery.  Elevate your leg when you are not up walking on it.   o Continue to use the breathing machine you got in the hospital (incentive spirometer) which will help keep your temperature down.  It is common for your temperature to cycle up and down following surgery, especially at night when you are not up moving around and exerting yourself.  The breathing machine keeps your lungs expanded and your temperature down.   DIET:  As you were doing prior to hospitalization, we recommend a well-balanced diet.  DRESSING / WOUND CARE / SHOWERING  Keep dressing dry and in Place  ACTIVITY  o Increase activity slowly as tolerated, but follow the weight bearing instructions below.   o No driving for 6 weeks or until further direction given by your physician.  You cannot drive while taking narcotics.  o No lifting or carrying greater than 10 lbs. until further directed by your surgeon. o Avoid periods of inactivity such as sitting longer than an hour when not asleep. This helps prevent blood clots.  o You may return to work once you are authorized by your doctor.     WEIGHT BEARING   Weight bearing as tolerated with assist device (walker, cane, etc) as directed, use it as long as suggested by your surgeon or therapist, typically at least 4-6 weeks.   EXERCISES  Results after joint replacement surgery are often greatly improved when you follow the exercise, range of motion and muscle strengthening exercises prescribed by your doctor. Safety measures are also  important to protect the joint from further injury. Any time any of these exercises cause you to have increased pain or swelling, decrease what you are doing until you are comfortable again and then slowly increase them. If you have problems or questions, call your caregiver or physical therapist for advice.   Rehabilitation is important following a joint replacement. After just a few days of immobilization, the muscles of the leg can become weakened and shrink (atrophy).  These exercises are designed to build up the tone and strength of the thigh and leg muscles and to improve motion. Often times heat used for twenty to thirty minutes before working out will loosen up your tissues and help with improving the range of motion but do not use heat for the first two weeks following surgery (sometimes heat can increase post-operative swelling).   These exercises can be done on a training (exercise) mat, on the floor, on a table or on a bed. Use whatever works the best and is most comfortable for you.    Use music or television while you are exercising so that the exercises are a pleasant break in your day. This will make your life better with the exercises acting as a break in your routine that you can look forward to.   Perform all exercises about fifteen times, three times per day or as directed.  You should exercise both the operative leg and the other leg as well.  Exercises   include:   . Quad Sets - Tighten up the muscle on the front of the thigh (Quad) and hold for 5-10 seconds.   . Straight Leg Raises - With your knee straight (if you were given a brace, keep it on), lift the leg to 60 degrees, hold for 3 seconds, and slowly lower the leg.  Perform this exercise against resistance later as your leg gets stronger.  . Leg Slides: Lying on your back, slowly slide your foot toward your buttocks, bending your knee up off the floor (only go as far as is comfortable). Then slowly slide your foot back down until  your leg is flat on the floor again.  . Angel Wings: Lying on your back spread your legs to the side as far apart as you can without causing discomfort.  . Hamstring Strength:  Lying on your back, push your heel against the floor with your leg straight by tightening up the muscles of your buttocks.  Repeat, but this time bend your knee to a comfortable angle, and push your heel against the floor.  You may put a pillow under the heel to make it more comfortable if necessary.   A rehabilitation program following joint replacement surgery can speed recovery and prevent re-injury in the future due to weakened muscles. Contact your doctor or a physical therapist for more information on knee rehabilitation.    CONSTIPATION  Constipation is defined medically as fewer than three stools per week and severe constipation as less than one stool per week.  Even if you have a regular bowel pattern at home, your normal regimen is likely to be disrupted due to multiple reasons following surgery.  Combination of anesthesia, postoperative narcotics, change in appetite and fluid intake all can affect your bowels.   YOU MUST use at least one of the following options; they are listed in order of increasing strength to get the job done.  They are all available over the counter, and you may need to use some, POSSIBLY even all of these options:    Drink plenty of fluids (prune juice may be helpful) and high fiber foods Colace 100 mg by mouth twice a day  Senokot for constipation as directed and as needed Dulcolax (bisacodyl), take with full glass of water  Miralax (polyethylene glycol) once or twice a day as needed.  If you have tried all these things and are unable to have a bowel movement in the first 3-4 days after surgery call either your surgeon or your primary doctor.    If you experience loose stools or diarrhea, hold the medications until you stool forms back up.  If your symptoms do not get better within 1 week  or if they get worse, check with your doctor.  If you experience "the worst abdominal pain ever" or develop nausea or vomiting, please contact the office immediately for further recommendations for treatment.   ITCHING:  If you experience itching with your medications, try taking only a single pain pill, or even half a pain pill at a time.  You can also use Benadryl over the counter for itching or also to help with sleep.   TED HOSE STOCKINGS:  Use stockings on both legs until for at least 2 weeks or as directed by physician office. They may be removed at night for sleeping.  MEDICATIONS:  See your medication summary on the "After Visit Summary" that nursing will review with you.  You may have some home medications which will be   placed on hold until you complete the course of blood thinner medication.  It is important for you to complete the blood thinner medication as prescribed.  PRECAUTIONS:  If you experience chest pain or shortness of breath - call 911 immediately for transfer to the hospital emergency department.   If you develop a fever greater that 101 F, purulent drainage from wound, increased redness or drainage from wound, foul odor from the wound/dressing, or calf pain - CONTACT YOUR SURGEON.                                                   FOLLOW-UP APPOINTMENTS:  If you do not already have a post-op appointment, please call the office for an appointment to be seen by your surgeon.  Guidelines for how soon to be seen are listed in your "After Visit Summary", but are typically between 1-4 weeks after surgery.  OTHER INSTRUCTIONS:   Knee Replacement:  Do not place pillow under knee, focus on keeping the knee straight while resting. CPM instructions: 0-90 degrees, 2 hours in the morning, 2 hours in the afternoon, and 2 hours in the evening. Place foam block, curve side up under heel at all times except when in CPM or when walking.  DO NOT modify, tear, cut, or change the foam block in any  way.  POST-OPERATIVE OPIOID TAPER INSTRUCTIONS: . It is important to wean off of your opioid medication as soon as possible. If you do not need pain medication after your surgery it is ok to stop day one. . Opioids include: o Codeine, Hydrocodone(Norco, Vicodin), Oxycodone(Percocet, oxycontin) and hydromorphone amongst others.  . Long term and even short term use of opiods can cause: o Increased pain response o Dependence o Constipation o Depression o Respiratory depression o And more.  . Withdrawal symptoms can include o Flu like symptoms o Nausea, vomiting o And more . Techniques to manage these symptoms o Hydrate well o Eat regular healthy meals o Stay active o Use relaxation techniques(deep breathing, meditating, yoga) . Do Not substitute Alcohol to help with tapering . If you have been on opioids for less than two weeks and do not have pain than it is ok to stop all together.  . Plan to wean off of opioids o This plan should start within one week post op of your joint replacement. o Maintain the same interval or time between taking each dose and first decrease the dose.  o Cut the total daily intake of opioids by one tablet each day o Next start to increase the time between doses. o The last dose that should be eliminated is the evening dose.     MAKE SURE YOU:  . Understand these instructions.  . Get help right away if you are not doing well or get worse.    Thank you for letting us be a part of your medical care team.  It is a privilege we respect greatly.  We hope these instructions will help you stay on track for a fast and full recovery!      

## 2021-02-27 NOTE — Anesthesia Procedure Notes (Signed)
Spinal  Patient location during procedure: OR Start time: 02/27/2021 8:35 AM End time: 02/27/2021 8:40 AM Staffing Performed: anesthesiologist  Anesthesiologist: Atilano Median, DO Preanesthetic Checklist Completed: patient identified, IV checked, site marked, risks and benefits discussed, surgical consent, monitors and equipment checked, pre-op evaluation and timeout performed Spinal Block Patient position: sitting Prep: DuraPrep Patient monitoring: heart rate, cardiac monitor, continuous pulse ox and blood pressure Approach: midline Location: L4-5 Injection technique: single-shot Needle Needle type: Pencan  Needle gauge: 24 G Needle length: 10 cm Assessment Events: CSF return Additional Notes Patient identified. Risks/Benefits/Options discussed with patient including but not limited to bleeding, infection, nerve damage, paralysis, failed block, incomplete pain control, headache, blood pressure changes, nausea, vomiting, reactions to medications, itching and postpartum back pain. Confirmed with bedside nurse the patient's most recent platelet count. Confirmed with patient that they are not currently taking any anticoagulation, have any bleeding history or any family history of bleeding disorders. Patient expressed understanding and wished to proceed. All questions were answered. Sterile technique was used throughout the entire procedure. Please see nursing notes for vital signs. Warning signs of high block given to the patient including shortness of breath, tingling/numbness in hands, complete motor block, or any concerning symptoms with instructions to call for help. Patient was given instructions on fall risk and not to get out of bed. All questions and concerns addressed with instructions to call with any issues or inadequate analgesia.

## 2021-02-27 NOTE — Plan of Care (Signed)
  Problem: Education: Goal: Knowledge of General Education information will improve Description: Including pain rating scale, medication(s)/side effects and non-pharmacologic comfort measures Outcome: Progressing   Problem: Health Behavior/Discharge Planning: Goal: Ability to manage health-related needs will improve Outcome: Progressing   Problem: Clinical Measurements: Goal: Ability to maintain clinical measurements within normal limits will improve Outcome: Progressing   Problem: Activity: Goal: Risk for activity intolerance will decrease Outcome: Progressing   Problem: Coping: Goal: Level of anxiety will decrease Outcome: Progressing   Problem: Pain Managment: Goal: General experience of comfort will improve Outcome: Progressing   Problem: Safety: Goal: Ability to remain free from injury will improve Outcome: Progressing   

## 2021-02-27 NOTE — Anesthesia Postprocedure Evaluation (Signed)
Anesthesia Post Note  Patient: AEDIN JEANSONNE  Procedure(s) Performed: LEFT TOTAL KNEE ARTHROPLASTY (Left Knee)     Patient location during evaluation: PACU Anesthesia Type: Regional, MAC and Spinal Level of consciousness: oriented and awake and alert Pain management: pain level controlled Vital Signs Assessment: post-procedure vital signs reviewed and stable Respiratory status: spontaneous breathing, respiratory function stable and patient connected to nasal cannula oxygen Cardiovascular status: blood pressure returned to baseline and stable Postop Assessment: no headache, no backache and no apparent nausea or vomiting Anesthetic complications: no   No complications documented.  Last Vitals:  Vitals:   02/27/21 1315 02/27/21 1350  BP: 127/75 135/82  Pulse: 72 76  Resp: 14   Temp: 36.7 C 37.1 C  SpO2: 95% 95%    Last Pain:  Vitals:   02/27/21 1350  TempSrc: Oral  PainSc: 0-No pain                 Belenda Cruise P Yoshika Vensel

## 2021-02-27 NOTE — Progress Notes (Signed)
PT Cancellation Note  Patient Details Name: Travis White MRN: [id number] DOB: 04/12/1961   Cancelled Treatment:    Reason Eval/Treat Not Completed: Medical issues which prohibited therapy Pt reporting nerve block effects still in place and to hold on PT. Will follow up as schedule allows.   Farley Ly, PT, DPT  Acute Rehabilitation Services  Pager: [phone] Office: [phone]    [name] 02/27/2021, 2:10 PM

## 2021-02-27 NOTE — Transfer of Care (Signed)
Immediate Anesthesia Transfer of Care Note  Patient: Travis White  Procedure(s) Performed: LEFT TOTAL KNEE ARTHROPLASTY (Left Knee)  Patient Location: PACU  Anesthesia Type:Spinal and MAC combined with regional for post-op pain  Level of Consciousness: sedated and patient cooperative  Airway & Oxygen Therapy: Patient Spontanous Breathing  Post-op Assessment: Report given to RN and Post -op Vital signs reviewed and stable  Post vital signs: Reviewed and stable  Last Vitals:  Vitals Value Taken Time  BP 102/74 02/27/21 1014  Temp    Pulse 67 02/27/21 1016  Resp 20 02/27/21 1016  SpO2 93 % 02/27/21 1016  Vitals shown include unvalidated device data.  Last Pain:  Vitals:   02/27/21 0710  TempSrc: Oral  PainSc:       Patients Stated Pain Goal: 3 (95/58/31 6742)  Complications: No complications documented.

## 2021-02-27 NOTE — Progress Notes (Signed)
CSW following patient for any d/c planning needs once medically stable.  Cleon Gustin, MSW, LCSWA

## 2021-02-27 NOTE — Anesthesia Procedure Notes (Signed)
Anesthesia Regional Block: Adductor canal block   Pre-Anesthetic Checklist: ,, timeout performed, Correct Patient, Correct Site, Correct Laterality, Correct Procedure, Correct Position, site marked, Risks and benefits discussed,  Surgical consent,  Pre-op evaluation,  At surgeon's request and post-op pain management  Laterality: Left  Prep: Dura Prep       Needles:  Injection technique: Single-shot  Needle Type: Echogenic Stimulator Needle     Needle Length: 10cm  Needle Gauge: 20     Additional Needles:   Procedures:,,,, ultrasound used (permanent image in chart),,,,  Narrative:  Start time: 02/27/2021 7:57 AM End time: 02/27/2021 7:59 AM Injection made incrementally with aspirations every 5 mL.  Performed by: Personally  Anesthesiologist: Atilano Median, DO  Additional Notes: Patient identified. Risks/Benefits/Options discussed with patient including but not limited to bleeding, infection, nerve damage, failed block, incomplete pain control. Patient expressed understanding and wished to proceed. All questions were answered. Sterile technique was used throughout the entire procedure. Please see nursing notes for vital signs. Aspirated in 5cc intervals with injection for negative confirmation. Patient was given instructions on fall risk and not to get out of bed. All questions and concerns addressed with instructions to call with any issues or inadequate analgesia.

## 2021-02-27 NOTE — H&P (Signed)
TOTAL KNEE ADMISSION H&P  Patient is being admitted for left total knee arthroplasty.  Subjective:  Chief Complaint:left knee pain.  HPI: Travis White, 60 y.o. male, has a history of pain and functional disability in the left knee due to arthritis and has failed non-surgical conservative treatments for greater than 12 weeks to includecorticosteriod injections, use of assistive devices and activity modification.  Onset of symptoms was gradual, starting 7 years ago with gradually worsening course since that time. The patient noted no past surgery on the left knee(s).  Patient currently rates pain in the left knee(s) at 7 out of 10 with activity. Patient has worsening of pain with activity and weight bearing.  Patient has evidence of subchondral cysts and joint space narrowing by imaging studies. This patient has had  There is no active infection.  There are no problems to display for this patient.  Past Medical History:  Diagnosis Date  . Arthritis   . Diabetes mellitus    type II    Past Surgical History:  Procedure Laterality Date  . Left elbow Left     Current Facility-Administered Medications  Medication Dose Route Frequency Provider Last Rate Last Admin  . ceFAZolin (ANCEF) IVPB 2g/100 mL premix  2 g Intravenous On Call to OR Angelos Wasco, [place], [place]       No Known Allergies  Social History   Tobacco Use  . Smoking status: Current Every Day Smoker    Packs/day: 1.00    Types: Cigarettes  . Smokeless tobacco: Never Used  Substance Use Topics  . Alcohol use: No    No family history on file.   Review of Systems  All other systems reviewed and are negative.   Objective:  Physical Exam  Vital signs in last 24 hours:    Labs:   Estimated body mass index is 37.99 kg/m as calculated from the following:   Height as of 02/25/21: 5\' 2"  (1.575 m).   Weight as of 02/25/21: 94.2 kg.   Imaging Review Plain radiographs demonstrate moderate degenerative joint disease of  the left knee(s). The overall alignment isneutral. The bone quality appears to be good for age and reported activity level.      Assessment/Plan:  End stage arthritis, left knee   The patient history, physical examination, clinical judgment of the provider and imaging studies are consistent with end stage degenerative joint disease of the left knee(s) and total knee arthroplasty is deemed medically necessary. The treatment options including medical management, injection therapy arthroscopy and arthroplasty were discussed at length. The risks and benefits of total knee arthroplasty were presented and reviewed. The risks due to aseptic loosening, infection, stiffness, patella tracking problems, thromboembolic complications and other imponderables were discussed. The patient acknowledged the explanation, agreed to proceed with the plan and consent was signed. Patient is being admitted for inpatient treatment for surgery, pain control, PT, OT, prophylactic antibiotics, VTE prophylaxis, progressive ambulation and ADL's and discharge planning. The patient is planning to be discharged home with home health services    Anticipated LOS equal to or greater than 2 midnights due to - Age 35 and older with one or more of the following:  - Obesity  - Expected need for hospital services (PT, OT, Nursing) required for safe  discharge  - Anticipated need for postoperative skilled nursing care or inpatient rehab  -  OR   - Unanticipated findings during/Post Surgery: None  - Patient is a high risk of re-admission due to: None

## 2021-02-28 ENCOUNTER — Encounter (HOSPITAL_COMMUNITY): Payer: Self-pay | Admitting: Orthopedic Surgery

## 2021-02-28 DIAGNOSIS — M1712 Unilateral primary osteoarthritis, left knee: Secondary | ICD-10-CM | POA: Diagnosis not present

## 2021-02-28 DIAGNOSIS — F17210 Nicotine dependence, cigarettes, uncomplicated: Secondary | ICD-10-CM | POA: Diagnosis not present

## 2021-02-28 DIAGNOSIS — E119 Type 2 diabetes mellitus without complications: Secondary | ICD-10-CM | POA: Diagnosis not present

## 2021-02-28 DIAGNOSIS — Z794 Long term (current) use of insulin: Secondary | ICD-10-CM | POA: Diagnosis not present

## 2021-02-28 DIAGNOSIS — Z23 Encounter for immunization: Secondary | ICD-10-CM | POA: Diagnosis not present

## 2021-02-28 LAB — GLUCOSE, CAPILLARY
Glucose-Capillary: 278 mg/dL — ABNORMAL HIGH (ref 70–99)
Glucose-Capillary: 245 mg/dL — ABNORMAL HIGH (ref 70–99)

## 2021-02-28 MED ORDER — ASPIRIN 325 MG PO TBEC: 325.0000 mg | DELAYED_RELEASE_TABLET | Freq: Every day | ORAL | 0 refills | Status: AC

## 2021-02-28 MED ORDER — OXYCODONE HCL 5 MG PO TABS: 5.0000 mg | ORAL_TABLET | ORAL | 0 refills | Status: DC | PRN

## 2021-02-28 NOTE — Progress Notes (Signed)
Patient ID: Travis White, male   DOB: 1960/12/03, 60 y.o.   MRN: [id number] Patient is postoperative day 1 left total knee arthroplasty.  Patient states he was able to ambulate to the bathroom last night.  There is no drainage in the wound VAC canister.  Plan for discharge to home this afternoon after therapy.  Discharged with the portable Praveena wound VAC pump.

## 2021-02-28 NOTE — TOC Transition Note (Signed)
Transition of Care [facility]) - CM/SW Discharge Note   Patient Details  Name: Travis White MRN: [id number] Date of Birth: 01-27-1961  Transition of [facility]) CM/SW Contact:  Milinda Antis, [place] Phone Number: 02/28/2021, 1:04 PM   Clinical Narrative:    Patient will DC to:  Home Anticipated DC date: 02/28/2021 Family notified: Yes Transport by: Family Member- niece   Per MD patient ready for DC home. RN, patient, patient's family, and home health agency notified of DC. Patient's address, phone number, and PCP were verified.  Patient reports that he lives at home with his spouse and mother and that needs will be met with family assistance and HHPT.  Patient was offered choice through medicare choice list for [facility] Dt agencies and choose [facility].  Patient will receive home health services from [facility] and will receive a 3 N 1.  Patient reported no concerns with affording medications.  Patient's niece will be transporting home.  CSW will sign off for now as social work intervention is no longer needed. Please consult [place] again if new needs arise.     Final next level of care: [place] Barriers to Discharge: No Barriers Identified   Patient Goals and CMS Choice Patient states their goals for this hospitalization and ongoing recovery are:: Return home and go fishing. CMS Medicare.gov Compare Post Acute Care list provided to:: Patient Choice offered to / list presented to : Patient  Discharge Placement                       Discharge Plan and Services     Post Acute Care Choice: [place]          DME Arranged: 3-N-1 DME Agency: [employer] Date DME Agency Contacted: 02/28/21 Time DME Agency Contacted: 1215 Representative spoke with at DME Agency: Freda Munro HH Arranged: PT [place]: [facility] (Now [place]) Date [place]: 02/28/21 Time [place]: 1210 Representative  spoke with at [place]: [place] Interventions     Readmission Risk Interventions No flowsheet data found.

## 2021-02-28 NOTE — Progress Notes (Addendum)
Physical Therapy Treatment Patient Details Name: Travis White MRN: [id number] DOB: [birth year] Today's Date: 02/28/2021    History of Present Illness Pt is a 60 y/o male s/p L TKA. PMH includes DM.    PT Comments    Pt using the bathroom upon arrival. Pt tolerated advancing gait further this session well. Ride home arrived during session. SPTA escorted pt out of the building and assisted with car transfer. Pt left in niece's vehicle to d/c home. All needs met.   Follow Up Recommendations  Follow surgeon's recommendation for DC plan and follow-up therapies     Equipment Recommendations  3in1 (PT)    Recommendations for Other Services       Precautions / Restrictions Precautions Precautions: Knee Restrictions Weight Bearing Restrictions: Yes LLE Weight Bearing: Weight bearing as tolerated    Mobility  Bed Mobility                    Transfers Overall transfer level: Needs assistance Equipment used: Rolling walker (2 wheeled) Transfers: Sit to/from Stand Sit to Stand: Supervision         General transfer comment: Supervision for safety. Cues for RW position. SPTA assisted with car transfer; pt required cues to scoot bottom back in seat further before swinging legs into car.  Ambulation/Gait Ambulation/Gait assistance: Min guard Gait Distance (Feet): 375 Feet Assistive device: Rolling walker (2 wheeled) Gait Pattern/deviations: Step-to pattern;Step-through pattern;Decreased stride length;Decreased step length - right     General Gait Details: Pt requiring cues to increase R step length. Heavy reliance on RW due to fatigue. Pt states he has been doing exercises off and on since SPTA left from AM session.   Stairs             Wheelchair Mobility    Modified Rankin (Stroke Patients Only)       Balance Overall balance assessment: Needs assistance Sitting-balance support: No upper extremity supported;Feet supported Sitting balance-Leahy Scale:  Good     Standing balance support: Bilateral upper extremity supported;During functional activity Standing balance-Leahy Scale: Fair                              Cognition Arousal/Alertness: Awake/alert Behavior During Therapy: WFL for tasks assessed/performed Overall Cognitive Status: Within Functional Limits for tasks assessed                                        Exercises      General Comments        Pertinent Vitals/Pain Pain Assessment: Faces Faces Pain Scale: Hurts a little bit Pain Location: L knee Pain Descriptors / Indicators: Aching Pain Intervention(s): Monitored during session    Home Living                      Prior Function            PT Goals (current goals can now be found in the care plan section) Acute Rehab PT Goals Potential to Achieve Goals: Good Progress towards PT goals: Progressing toward goals    Frequency    7X/week      PT Plan Current plan remains appropriate    Co-evaluation              AM-PAC PT "6 Clicks" Mobility   Outcome Measure  Help needed  turning from your back to your side while in a flat bed without using bedrails?: None Help needed moving from lying on your back to sitting on the side of a flat bed without using bedrails?: None Help needed moving to and from a bed to a chair (including a wheelchair)?: A Little Help needed standing up from a chair using your arms (e.g., wheelchair or bedside chair)?: None Help needed to walk in hospital room?: A Little Help needed climbing 3-5 steps with a railing? : A Little 6 Click Score: 21    End of Session Equipment Utilized During Treatment: Gait belt Activity Tolerance: Patient tolerated treatment well Patient left: in chair;Other (comment) (awaiting d/c) Nurse Communication: Mobility status PT Visit Diagnosis: Other abnormalities of gait and mobility (R26.89);Pain Pain - Right/Left: Left Pain - part of body: Knee      Time: 1250-1315 PT Time Calculation (min) (ACUTE ONLY): 25 min  Charges:  $Gait Training: 8-22 mins $Therapeutic Activity: 8-22 mins                      Sandria Manly, SPTA    Sandria Manly 02/28/2021, 3:38 PM

## 2021-02-28 NOTE — Progress Notes (Signed)
Physical Therapy Treatment Patient Details Name: Travis White MRN: [id number] DOB: December 17, 1960 Today's Date: 02/28/2021    History of Present Illness Pt is a 60 y/o male s/p L TKA. PMH includes DM.    PT Comments    Pt was sleeping upon arrival, but easily roused. Upon sitting, educated pt on weaving wound vac through pant leg. Pt progressed with gait and to stair training this AM. He lacks 7 degrees of extension and was educated on the importance of something under the ankle and not the knee. He would benefit from further PT to increase ROM, strength and functional independence. Plan to continue gait training and exercises at PM session. Plan at d/c remains appropriate.   Follow Up Recommendations  Follow surgeon's recommendation for DC plan and follow-up therapies     Equipment Recommendations  3in1 (PT)    Recommendations for Other Services       Precautions / Restrictions Precautions Precautions: Knee Precaution Comments: Verbally  reveiewed precautions and pt demonstrates understanding. Restrictions Weight Bearing Restrictions: Yes LLE Weight Bearing: Weight bearing as tolerated    Mobility  Bed Mobility Overal bed mobility: Needs Assistance Bed Mobility: Supine to Sit     Supine to sit: Supervision     General bed mobility comments: Supervision for safety.    Transfers Overall transfer level: Needs assistance Equipment used: Rolling walker (2 wheeled) Transfers: Sit to/from Stand Sit to Stand: Supervision         General transfer comment: Supervision for safety. Pt demonstrates good technique with transfers.  Ambulation/Gait Ambulation/Gait assistance: Min guard Gait Distance (Feet): 200 Feet Assistive device: Rolling walker (2 wheeled) Gait Pattern/deviations: Step-to pattern;Step-through pattern;Decreased stride length;Decreased step length - right     General Gait Details: Pt requiring cues to advance tolerance on LLE by increasing step length on  right. Requiring cues to push walker vs picking it up and keep it closer as he was pushing it so far out in front that he flexed at trunk.   Stairs Stairs: Yes Stairs assistance: Min guard Stair Management: One rail Right;Step to pattern Number of Stairs: 6 General stair comments: Pt has option of L or R rail at home, but not both at the same time. He was able to ascend/descend stairs using step to pattern using one rail (right) with min guard for safety. Pt demonstrates good understanding of technique for going up/down stairs (up with good/down with bad).   Wheelchair Mobility    Modified Rankin (Stroke Patients Only)       Balance Overall balance assessment: Needs assistance Sitting-balance support: No upper extremity supported;Feet supported Sitting balance-Leahy Scale: Good     Standing balance support: Bilateral upper extremity supported;During functional activity Standing balance-Leahy Scale: Fair Standing balance comment: Reliant on BUE support for ambulation, but able to static stand without support and cannot tolerate challenge.                            Cognition Arousal/Alertness: Awake/alert Behavior During Therapy: WFL for tasks assessed/performed Overall Cognitive Status: Within Functional Limits for tasks assessed                                        Exercises Total Joint Exercises Ankle Circles/Pumps: AROM;Both;20 reps;Supine Quad Sets: AROM;Left;Supine;10 reps Towel Squeeze: AROM;Left;10 reps;Supine Short Arc Quad: AROM;Supine;Left;10 reps Heel Slides: AROM;Left;Supine;10 reps Hip  ABduction/ADduction: AROM;Left;10 reps;Supine Straight Leg Raises: AROM;Left;10 reps;Supine Goniometric ROM: AROM 7-86 degrees L Knee flexion    General Comments        Pertinent Vitals/Pain Pain Assessment: 0-10 Pain Score: 6  Pain Location: L knee Pain Descriptors / Indicators: Aching Pain Intervention(s): Limited activity within  patient's tolerance;Monitored during session;Repositioned    Home Living                      Prior Function            PT Goals (current goals can now be found in the care plan section) Acute Rehab PT Goals Patient Stated Goal: to walk better and get back to fishing. Potential to Achieve Goals: Good Progress towards PT goals: Progressing toward goals    Frequency    7X/week      PT Plan Current plan remains appropriate    Co-evaluation              AM-PAC PT "6 Clicks" Mobility   Outcome Measure  Help needed turning from your back to your side while in a flat bed without using bedrails?: None Help needed moving from lying on your back to sitting on the side of a flat bed without using bedrails?: None Help needed moving to and from a bed to a chair (including a wheelchair)?: A Little Help needed standing up from a chair using your arms (e.g., wheelchair or bedside chair)?: None Help needed to walk in hospital room?: A Little Help needed climbing 3-5 steps with a railing? : A Little 6 Click Score: 21    End of Session Equipment Utilized During Treatment: Gait belt Activity Tolerance: Patient tolerated treatment well Patient left: in chair;with call bell/phone within reach;with chair alarm set Nurse Communication: Mobility status PT Visit Diagnosis: Other abnormalities of gait and mobility (R26.89);Pain Pain - Right/Left: Left Pain - part of body: Knee     Time: 1004-1040 PT Time Calculation (min) (ACUTE ONLY): 36 min  Charges:  $Gait Training: 8-22 mins $Therapeutic Exercise: 8-22 mins                      Kathrine Haddock, SPTA    Kathrine Haddock 02/28/2021, 12:19 PM

## 2021-02-28 NOTE — Discharge Summary (Signed)
Discharge Diagnoses:  Active Problems:   Unilateral primary osteoarthritis, left knee   Arthritis of left knee   Surgeries: Procedure(s): LEFT TOTAL KNEE ARTHROPLASTY on 02/27/2021    Consultants:   Discharged Condition: Improved  Hospital Course: Travis White is an 60 y.o. male who was admitted 02/27/2021 with a chief complaint of left knee arthritis, with a final diagnosis of Osteoarthritis Left Knee.  Patient was brought to the operating room on 02/27/2021 and underwent Procedure(s): LEFT TOTAL KNEE ARTHROPLASTY.    Patient was given perioperative antibiotics:  Anti-infectives (From admission, onward)   Start     Dose/Rate Route Frequency Ordered Stop   02/27/21 1445  ceFAZolin (ANCEF) IVPB 2g/100 mL premix        2 g 200 mL/hr over 30 Minutes Intravenous Every 6 hours 02/27/21 1350 02/27/21 2155   02/27/21 0645  ceFAZolin (ANCEF) IVPB 2g/100 mL premix        2 g 200 mL/hr over 30 Minutes Intravenous On call to O.R. 02/27/21 8101 02/27/21 0915    .  Patient was given sequential compression devices, early ambulation, and aspirin for DVT prophylaxis.  Recent vital signs:  Patient Vitals for the past 24 hrs:  BP Temp Temp src Pulse Resp SpO2  02/28/21 0738 (!) 148/79 98.7 F (37.1 C) Oral 77 18 95 %  02/28/21 0427 (!) 142/82 98.4 F (36.9 C) Oral 79 18 95 %  02/28/21 0107 130/70 98.5 F (36.9 C) Oral 81 15 94 %  02/27/21 2042 (!) 141/74 98.5 F (36.9 C) Oral 88 -- 94 %  02/27/21 1350 135/82 98.7 F (37.1 C) Oral 76 -- 95 %  02/27/21 1315 127/75 98.1 F (36.7 C) -- 72 14 95 %  02/27/21 1300 126/74 -- -- 74 13 95 %  02/27/21 1245 122/72 -- -- 66 11 94 %  02/27/21 1230 122/74 -- -- 65 13 93 %  02/27/21 1215 124/77 -- -- 68 14 95 %  02/27/21 1200 115/76 -- -- 72 11 97 %  02/27/21 1145 124/80 -- -- 74 10 94 %  02/27/21 1130 122/78 -- -- 66 14 94 %  02/27/21 1115 122/73 -- -- 65 11 93 %  02/27/21 1100 109/64 -- -- 69 11 97 %  02/27/21 1045 113/70 -- -- 65 13 94 %   02/27/21 1030 108/68 -- -- 63 11 93 %  02/27/21 1015 102/74 97.9 F (36.6 C) -- 69 14 94 %  02/27/21 0820 -- -- -- 83 20 94 %  02/27/21 0819 -- -- -- 80 16 94 %  02/27/21 0818 -- -- -- 81 16 94 %  02/27/21 0817 -- -- -- 83 15 95 %  02/27/21 0816 -- -- -- 83 16 94 %  02/27/21 0815 -- -- -- 83 15 94 %  02/27/21 0814 -- -- -- 82 19 95 %  02/27/21 0813 -- -- -- 84 16 96 %  02/27/21 0812 -- -- -- 84 16 95 %  02/27/21 0811 -- -- -- 84 (!) 9 96 %  02/27/21 0810 -- -- -- 85 (!) 8 96 %  02/27/21 0809 -- -- -- 81 17 97 %  02/27/21 0808 -- -- -- 86 20 98 %  02/27/21 0807 -- -- -- 85 (!) 21 97 %  02/27/21 0806 -- -- -- 87 15 95 %  .  Recent laboratory studies: No results found.  Discharge Medications:   Allergies as of 02/28/2021   No Known Allergies  Medication List    STOP taking these medications   diclofenac 50 MG EC tablet Commonly known as: VOLTAREN   HYDROcodone-acetaminophen 5-325 MG tablet Commonly known as: NORCO/VICODIN   insulin glargine 100 UNIT/ML injection Commonly known as: LANTUS     TAKE these medications   acetaminophen 650 MG CR tablet Commonly known as: TYLENOL Take 1,300 mg by mouth every 8 (eight) hours as needed for pain.   aspirin 325 MG EC tablet Take 1 tablet (325 mg total) by mouth daily with breakfast.   atorvastatin 40 MG tablet Commonly known as: LIPITOR Take 40 mg by mouth daily.   B-D ULTRAFINE III SHORT PEN 31G X 8 MM Misc Generic drug: Insulin Pen Needle   chlorthalidone 25 MG tablet Commonly known as: HYGROTON Take 25 mg by mouth daily.   Farxiga 10 MG Tabs tablet Generic drug: dapagliflozin propanediol Take 5 mg by mouth every morning.   HumaLOG KwikPen 100 UNIT/ML KwikPen Generic drug: insulin lispro Inject 16 Units into the skin 2 (two) times daily with a meal.   losartan 100 MG tablet Commonly known as: COZAAR Take 100 mg by mouth daily.   metFORMIN 1000 MG tablet Commonly known as: GLUCOPHAGE Take 1,000 mg by  mouth 2 (two) times daily with a meal.   oxyCODONE 5 MG immediate release tablet Commonly known as: Oxy IR/ROXICODONE Take 1-2 tablets (5-10 mg total) by mouth every 4 (four) hours as needed for moderate pain (pain score 4-6).   Tresiba 100 UNIT/ML Soln Generic drug: Insulin Degludec Inject 64 Units into the skin daily.   Trulicity 3 MG/0.5ML Sopn Generic drug: Dulaglutide Inject 3 mg into the skin every 7 (seven) days. Sat       Diagnostic Studies: No results found.  Patient benefited maximally from their hospital stay and there were no complications.     Disposition: Discharge disposition: 01-Home or Self Care      Discharge Instructions    Call MD / Call 911   Complete by: As directed    If you experience chest pain or shortness of breath, CALL 911 and be transported to the hospital emergency room.  If you develope a fever above 101 F, pus (white drainage) or increased drainage or redness at the wound, or calf pain, call your surgeon's office.   Constipation Prevention   Complete by: As directed    Drink plenty of fluids.  Prune juice may be helpful.  You may use a stool softener, such as Colace (over the counter) 100 mg twice a day.  Use MiraLax (over the counter) for constipation as needed.   Diet - low sodium heart healthy   Complete by: As directed    Do not put a pillow under the knee. Place it under the heel.   Complete by: As directed    Increase activity slowly as tolerated   Complete by: As directed    Post-operative opioid taper instructions:   Complete by: As directed    POST-OPERATIVE OPIOID TAPER INSTRUCTIONS: It is important to wean off of your opioid medication as soon as possible. If you do not need pain medication after your surgery it is ok to stop day one. Opioids include: Codeine, Hydrocodone(Norco, Vicodin), Oxycodone(Percocet, oxycontin) and hydromorphone amongst others.  Long term and even short term use of opiods can cause: Increased pain  response Dependence Constipation Depression Respiratory depression And more.  Withdrawal symptoms can include Flu like symptoms Nausea, vomiting And more Techniques to manage these symptoms Hydrate well  Eat regular healthy meals Stay active Use relaxation techniques(deep breathing, meditating, yoga) Do Not substitute Alcohol to help with tapering If you have been on opioids for less than two weeks and do not have pain than it is ok to stop all together.  Plan to wean off of opioids This plan should start within one week post op of your joint replacement. Maintain the same interval or time between taking each dose and first decrease the dose.  Cut the total daily intake of opioids by one tablet each day Next start to increase the time between doses. The last dose that should be eliminated is the evening dose.         Follow-up Information    Jasmene Goswami, [place], [place] In 1 week.   Specialty: Orthopedic Surgery Contact information: [address] [place] [address] [phone]                Signed: [place] Leandra Vanderweele 02/28/2021, 7:59 AM

## 2021-03-01 DIAGNOSIS — Z96652 Presence of left artificial knee joint: Secondary | ICD-10-CM | POA: Diagnosis not present

## 2021-03-01 DIAGNOSIS — F17210 Nicotine dependence, cigarettes, uncomplicated: Secondary | ICD-10-CM | POA: Diagnosis not present

## 2021-03-01 DIAGNOSIS — Z7982 Long term (current) use of aspirin: Secondary | ICD-10-CM | POA: Diagnosis not present

## 2021-03-01 DIAGNOSIS — Z471 Aftercare following joint replacement surgery: Secondary | ICD-10-CM | POA: Diagnosis not present

## 2021-03-01 DIAGNOSIS — E119 Type 2 diabetes mellitus without complications: Secondary | ICD-10-CM | POA: Diagnosis not present

## 2021-03-01 DIAGNOSIS — Z794 Long term (current) use of insulin: Secondary | ICD-10-CM | POA: Diagnosis not present

## 2021-03-01 DIAGNOSIS — Z7984 Long term (current) use of oral hypoglycemic drugs: Secondary | ICD-10-CM | POA: Diagnosis not present

## 2021-03-05 DIAGNOSIS — Z471 Aftercare following joint replacement surgery: Secondary | ICD-10-CM | POA: Diagnosis not present

## 2021-03-05 DIAGNOSIS — Z96652 Presence of left artificial knee joint: Secondary | ICD-10-CM | POA: Diagnosis not present

## 2021-03-05 DIAGNOSIS — Z794 Long term (current) use of insulin: Secondary | ICD-10-CM | POA: Diagnosis not present

## 2021-03-05 DIAGNOSIS — F17210 Nicotine dependence, cigarettes, uncomplicated: Secondary | ICD-10-CM | POA: Diagnosis not present

## 2021-03-05 DIAGNOSIS — E119 Type 2 diabetes mellitus without complications: Secondary | ICD-10-CM | POA: Diagnosis not present

## 2021-03-05 DIAGNOSIS — Z7984 Long term (current) use of oral hypoglycemic drugs: Secondary | ICD-10-CM | POA: Diagnosis not present

## 2021-03-05 DIAGNOSIS — Z7982 Long term (current) use of aspirin: Secondary | ICD-10-CM | POA: Diagnosis not present

## 2021-03-06 ENCOUNTER — Ambulatory Visit (INDEPENDENT_AMBULATORY_CARE_PROVIDER_SITE_OTHER): Payer: BC Managed Care – PPO | Admitting: Physician Assistant

## 2021-03-06 ENCOUNTER — Encounter: Payer: Self-pay | Admitting: Physician Assistant

## 2021-03-06 DIAGNOSIS — F17210 Nicotine dependence, cigarettes, uncomplicated: Secondary | ICD-10-CM | POA: Diagnosis not present

## 2021-03-06 DIAGNOSIS — Z7984 Long term (current) use of oral hypoglycemic drugs: Secondary | ICD-10-CM | POA: Diagnosis not present

## 2021-03-06 DIAGNOSIS — M1712 Unilateral primary osteoarthritis, left knee: Secondary | ICD-10-CM

## 2021-03-06 DIAGNOSIS — Z7982 Long term (current) use of aspirin: Secondary | ICD-10-CM | POA: Diagnosis not present

## 2021-03-06 DIAGNOSIS — Z794 Long term (current) use of insulin: Secondary | ICD-10-CM | POA: Diagnosis not present

## 2021-03-06 DIAGNOSIS — Z471 Aftercare following joint replacement surgery: Secondary | ICD-10-CM | POA: Diagnosis not present

## 2021-03-06 DIAGNOSIS — E119 Type 2 diabetes mellitus without complications: Secondary | ICD-10-CM | POA: Diagnosis not present

## 2021-03-06 DIAGNOSIS — Z96652 Presence of left artificial knee joint: Secondary | ICD-10-CM | POA: Diagnosis not present

## 2021-03-06 MED ORDER — OXYCODONE HCL 5 MG PO TABS: 5.0000 mg | ORAL_TABLET | ORAL | 0 refills | Status: DC | PRN

## 2021-03-06 NOTE — Progress Notes (Signed)
Office Visit Note   Patient: Travis White           Date of Birth: Jun 29, 1961           MRN: [id number] Visit Date: 03/06/2021              Requested by: No referring provider defined for this encounter. PCP: Patient, No Pcp Per (Inactive)  No chief complaint on file.     HPI: Patient is a pleasant 60 year old gentleman who is 1 week status post total knee arthroplasty on the left.  He is accompanied by his niece.  He is overall doing well and has begun home physical therapy.  He denies fever, chills, or calf pain  Assessment & Plan: Visit Diagnoses: No diagnosis found.  Plan: Patient will begin daily cleansing and dressing changes Dial soap and water.  We will continue to work with physical therapy we will follow-up in 1 week at which time x-rays of his knee should be obtained he will continue to take his aspirin  Follow-Up Instructions: No follow-ups on file.   Ortho Exam  Patient is alert, oriented, no adenopathy, well-dressed, normal affect, normal respiratory effort. Wound VAC was removed without difficulty.  He has well apposed wound edges surgical staples are in place.  He has moderate soft tissue swelling but no erythema no cellulitis no signs of infection.  He has a negative Homans' sign no calf tenderness is able to flex his ankle without difficulty compartments are compressible  Imaging: No results found. No images are attached to the encounter.  Labs: Lab Results  Component Value Date   HGBA1C 7.4 (H) 02/27/2021   HGBA1C (H) 12/12/2010    8.7 (NOTE)                                                                       According to the ADA Clinical Practice Recommendations for 2011, when HbA1c is used as a screening test:   >=6.5%   Diagnostic of Diabetes Mellitus           (if abnormal result  is confirmed)  5.7-6.4%   Increased risk of developing Diabetes Mellitus  References:Diagnosis and Classification of Diabetes Mellitus,Diabetes Care,2011,34(Suppl  1):S62-S69 and Standards of Medical Care in         Diabetes - 2011,Diabetes Care,2011,34  (Suppl 1):S11-S61.     No results found for: ALBUMIN, PREALBUMIN, CBC  No results found for: MG No results found for: VD25OH  No results found for: PREALBUMIN CBC EXTENDED Latest Ref Rng & Units 02/25/2021 12/13/2010 12/12/2010  WBC 4.0 - 10.5 K/uL 11.4(H) - -  RBC 4.22 - 5.81 MIL/uL 5.12 - -  HGB 13.0 - 17.0 g/dL 83.1 11.0(L) 12.2(L)  HCT 39.0 - 52.0 % 45.6 33.2(L) 35.5(L)  PLT 150 - 400 K/uL 249 - -     There is no height or weight on file to calculate BMI.  Orders:  No orders of the defined types were placed in this encounter.  No orders of the defined types were placed in this encounter.    Procedures: No procedures performed  Clinical Data: No additional findings.  ROS:  All other systems negative, except as noted in the HPI. Review of Systems  Objective: Vital Signs: There were no vitals taken for this visit.  Specialty Comments:  No specialty comments available.  PMFS History: Patient Active Problem List   Diagnosis Date Noted  . Arthritis of left knee 02/27/2021  . Unilateral primary osteoarthritis, left knee    Past Medical History:  Diagnosis Date  . Arthritis   . Diabetes mellitus    type II    No family history on file.  Past Surgical History:  Procedure Laterality Date  . Left elbow Left   . TOTAL KNEE ARTHROPLASTY Left 02/27/2021   Procedure: LEFT TOTAL KNEE ARTHROPLASTY;  Surgeon: Nadara Mustard, MD;  Location: [facility] OR;  Service: Orthopedics;  Laterality: Left;   Social History   Occupational History  . Not on file  Tobacco Use  . Smoking status: Current Every Day Smoker    Packs/day: 1.00    Types: Cigarettes  . Smokeless tobacco: Never Used  Vaping Use  . Vaping Use: Never used  Substance and Sexual Activity  . Alcohol use: No  . Drug use: No  . Sexual activity: Not on file

## 2021-03-06 NOTE — Addendum Note (Signed)
Addended by: Polly Cobia on: 03/06/2021 09:48 AM   Modules accepted: Orders

## 2021-03-08 ENCOUNTER — Other Ambulatory Visit: Payer: Self-pay

## 2021-03-08 ENCOUNTER — Telehealth: Payer: Self-pay

## 2021-03-08 DIAGNOSIS — Z7982 Long term (current) use of aspirin: Secondary | ICD-10-CM | POA: Diagnosis not present

## 2021-03-08 DIAGNOSIS — Z96652 Presence of left artificial knee joint: Secondary | ICD-10-CM | POA: Diagnosis not present

## 2021-03-08 DIAGNOSIS — Z471 Aftercare following joint replacement surgery: Secondary | ICD-10-CM | POA: Diagnosis not present

## 2021-03-08 DIAGNOSIS — Z794 Long term (current) use of insulin: Secondary | ICD-10-CM | POA: Diagnosis not present

## 2021-03-08 DIAGNOSIS — F17210 Nicotine dependence, cigarettes, uncomplicated: Secondary | ICD-10-CM | POA: Diagnosis not present

## 2021-03-08 DIAGNOSIS — E119 Type 2 diabetes mellitus without complications: Secondary | ICD-10-CM | POA: Diagnosis not present

## 2021-03-08 DIAGNOSIS — Z7984 Long term (current) use of oral hypoglycemic drugs: Secondary | ICD-10-CM | POA: Diagnosis not present

## 2021-03-08 DIAGNOSIS — M1711 Unilateral primary osteoarthritis, right knee: Secondary | ICD-10-CM

## 2021-03-08 NOTE — Telephone Encounter (Signed)
S/p tot al knee order in chart

## 2021-03-08 NOTE — Telephone Encounter (Signed)
Patient called he is requesting a referral to be sent for physical therapy, patient stated next week will be the last week for home therapy. call back:[phone]

## 2021-03-11 ENCOUNTER — Encounter: Payer: Self-pay | Admitting: Physical Therapy

## 2021-03-11 ENCOUNTER — Other Ambulatory Visit: Payer: Self-pay

## 2021-03-11 ENCOUNTER — Ambulatory Visit (INDEPENDENT_AMBULATORY_CARE_PROVIDER_SITE_OTHER): Payer: BC Managed Care – PPO | Admitting: Physical Therapy

## 2021-03-11 DIAGNOSIS — M6281 Muscle weakness (generalized): Secondary | ICD-10-CM | POA: Diagnosis not present

## 2021-03-11 DIAGNOSIS — R600 Localized edema: Secondary | ICD-10-CM

## 2021-03-11 DIAGNOSIS — R2689 Other abnormalities of gait and mobility: Secondary | ICD-10-CM

## 2021-03-11 DIAGNOSIS — M25662 Stiffness of left knee, not elsewhere classified: Secondary | ICD-10-CM

## 2021-03-11 DIAGNOSIS — M25562 Pain in left knee: Secondary | ICD-10-CM

## 2021-03-11 NOTE — Therapy (Addendum)
[facility] [address] [place], [place], [address] Phone: [phone]   Fax:  [phone]  Physical Therapy Evaluation  Patient Details  Name: Travis White MRN: [id number] Date of Birth: June 04, 1961 Referring Provider (PT): Dr Aldean Baker   Encounter Date: 03/11/2021    Past Medical History:  Diagnosis Date  . Arthritis   . Diabetes mellitus    type II    Past Surgical History:  Procedure Laterality Date  . Left elbow Left   . TOTAL KNEE ARTHROPLASTY Left 02/27/2021   Procedure: LEFT TOTAL KNEE ARTHROPLASTY;  Surgeon: Nadara Mustard, MD;  Location: [facility] OR;  Service: Orthopedics;  Laterality: Left;    There were no vitals filed for this visit.                    Objective measurements completed on examination: See above findings.                 PT Short Term Goals - 03/14/21 1433      PT SHORT TERM GOAL #1   Title I with HEP to include standing balance work    Time 3    Period Weeks    Status On-going    Target Date 04/01/21      PT SHORT TERM GOAL #2   Title improve Lt knee flexion =/> 95 degrees to allow ease with stairs    Time 3    Period Weeks    Status On-going    Target Date 04/01/21      PT SHORT TERM GOAL #3   Title demo good Lt quad contraction to eliminate quad lag with SLR    Time 3    Period Weeks    Status On-going    Target Date 04/01/21      PT SHORT TERM GOAL #4   Title ambulate without assistive device on even surfaces with good form    Time 3    Period Weeks    Status On-going    Target Date 04/01/21             PT Long Term Goals - 03/11/21 1003      PT LONG TERM GOAL #1   Title I with advanced HEP    Time 6    Period Weeks    Status New    Target Date 04/22/21      PT LONG TERM GOAL #2   Title improve Lt knee ROM to -2 extension and 115 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    Time 6    Period Weeks    Status New    Target Date 04/22/21       PT LONG TERM GOAL #3   Title demo Lt LE strength =/> 5/5 to allow return to normal acitivity    Time 6    Period Weeks    Status New    Target Date 04/22/21      PT LONG TERM GOAL #4   Title improve FOTO =/> 60    Time 6    Period Weeks    Status New    Target Date 04/22/21      PT LONG TERM GOAL #5   Title demo good balance to tolerate fishing on a boat    Time 6    Period Weeks    Status New    Target Date 04/22/21  Patient will benefit from skilled therapeutic intervention in order to improve the following deficits and impairments:  Abnormal gait,Decreased range of motion,Difficulty walking,Obesity,Pain,Decreased balance,Decreased strength  Visit Diagnosis: Stiffness of left knee, not elsewhere classified - Plan: PT plan of care cert/re-cert  Muscle weakness (generalized) - Plan: PT plan of care cert/re-cert  Acute pain of left knee - Plan: PT plan of care cert/re-cert  Other abnormalities of gait and mobility - Plan: PT plan of care cert/re-cert  Localized edema     Problem List Patient Active Problem List   Diagnosis Date Noted  . Arthritis of left knee 02/27/2021  . Unilateral primary osteoarthritis, left knee     Roderic Scarce PT  03/18/2021, 9:31 AM  [facility] [address] [place], [place], [address] Phone: [phone]   Fax:  [phone]  Name: SHIVEN JUNIOUS MRN: [id number] Date of Birth: October 10, 1961

## 2021-03-13 ENCOUNTER — Ambulatory Visit: Payer: Self-pay

## 2021-03-13 ENCOUNTER — Encounter: Payer: Self-pay | Admitting: Physician Assistant

## 2021-03-13 ENCOUNTER — Ambulatory Visit (INDEPENDENT_AMBULATORY_CARE_PROVIDER_SITE_OTHER): Payer: BC Managed Care – PPO | Admitting: Physician Assistant

## 2021-03-13 DIAGNOSIS — G8929 Other chronic pain: Secondary | ICD-10-CM

## 2021-03-13 DIAGNOSIS — M25562 Pain in left knee: Secondary | ICD-10-CM | POA: Diagnosis not present

## 2021-03-13 DIAGNOSIS — M25561 Pain in right knee: Secondary | ICD-10-CM

## 2021-03-13 NOTE — Progress Notes (Signed)
Office Visit Note   Patient: Travis White           Date of Birth: 1961/03/10           MRN: [id number] Visit Date: 03/13/2021              Requested by: Nadara Mustard, MD [address] [place],  [place] [address] PCP: Patient, No Pcp Per (Inactive)  Chief Complaint  Patient presents with  . Left Knee - Follow-up      HPI: Patient presents today 2-week status post left total knee arthroplasty.  He denies fever, chills, or calf pain.  He has begun working with outpatient physical therapy.  He is requesting a refill of his pain medication  Assessment & Plan: Visit Diagnoses:  1. Acute pain of right knee   2. Chronic pain of left knee     Plan: I did provide the patient with a double XL compression stocking.  This fit well though I think in a couple weeks he will go down to a smaller size.  He will continue with physical therapy.  Follow-up in 2 weeks  Follow-Up Instructions: No follow-ups on file.   Ortho Exam  Patient is alert, oriented, no adenopathy, well-dressed, normal affect, normal respiratory effort. Well apposed wound edges no surrounding erythema or cellulitis surgical staples are in place these were harvested and Steri-Strips loosely placed.  He has quite a bit of swelling in his calf and bruising.  He has a negative Homans' sign no tenderness to the calf with palpation he is lacking approximately 4 degrees of full extension and easily flexes to 90 degrees.  No evidence of ascending cellulitis no evidence of any infection  Imaging: XR Knee 1-2 Views Left  Result Date: 03/13/2021 2 views of his left knee demonstrate well-maintained alignment.  Components in good position  No images are attached to the encounter.  Labs: Lab Results  Component Value Date   HGBA1C 7.4 (H) 02/27/2021   HGBA1C (H) 12/12/2010    8.7 (NOTE)                                                                       According to the ADA Clinical Practice Recommendations for 2011, when  HbA1c is used as a screening test:   >=6.5%   Diagnostic of Diabetes Mellitus           (if abnormal result  is confirmed)  5.7-6.4%   Increased risk of developing Diabetes Mellitus  References:Diagnosis and Classification of Diabetes Mellitus,Diabetes Care,2011,34(Suppl 1):S62-S69 and Standards of Medical Care in         Diabetes - 2011,Diabetes Care,2011,34  (Suppl 1):S11-S61.     No results found for: ALBUMIN, PREALBUMIN, CBC  No results found for: MG No results found for: VD25OH  No results found for: PREALBUMIN CBC EXTENDED Latest Ref Rng & Units 02/25/2021 12/13/2010 12/12/2010  WBC 4.0 - 10.5 K/uL 11.4(H) - -  RBC 4.22 - 5.81 MIL/uL 5.12 - -  HGB 13.0 - 17.0 g/dL 02.7 11.0(L) 12.2(L)  HCT 39.0 - 52.0 % 45.6 33.2(L) 35.5(L)  PLT 150 - 400 K/uL 249 - -     There is no height or weight on file to calculate  BMI.  Orders:  Orders Placed This Encounter  Procedures  . XR Knee 1-2 Views Left   No orders of the defined types were placed in this encounter.    Procedures: No procedures performed  Clinical Data: No additional findings.  ROS:  All other systems negative, except as noted in the HPI. Review of Systems  Objective: Vital Signs: There were no vitals taken for this visit.  Specialty Comments:  No specialty comments available.  PMFS History: Patient Active Problem List   Diagnosis Date Noted  . Arthritis of left knee 02/27/2021  . Unilateral primary osteoarthritis, left knee    Past Medical History:  Diagnosis Date  . Arthritis   . Diabetes mellitus    type II    No family history on file.  Past Surgical History:  Procedure Laterality Date  . Left elbow Left   . TOTAL KNEE ARTHROPLASTY Left 02/27/2021   Procedure: LEFT TOTAL KNEE ARTHROPLASTY;  Surgeon: Nadara Mustard, MD;  Location: [facility] OR;  Service: Orthopedics;  Laterality: Left;   Social History   Occupational History  . Not on file  Tobacco Use  . Smoking status: Current Every Day Smoker     Packs/day: 1.00    Types: Cigarettes  . Smokeless tobacco: Never Used  Vaping Use  . Vaping Use: Never used  Substance and Sexual Activity  . Alcohol use: No  . Drug use: No  . Sexual activity: Not on file

## 2021-03-14 ENCOUNTER — Other Ambulatory Visit: Payer: Self-pay

## 2021-03-14 ENCOUNTER — Encounter: Payer: Self-pay | Admitting: Rehabilitative and Restorative Service Providers"

## 2021-03-14 ENCOUNTER — Other Ambulatory Visit: Payer: Self-pay | Admitting: Physician Assistant

## 2021-03-14 ENCOUNTER — Ambulatory Visit (INDEPENDENT_AMBULATORY_CARE_PROVIDER_SITE_OTHER): Payer: BC Managed Care – PPO | Admitting: Rehabilitative and Restorative Service Providers"

## 2021-03-14 ENCOUNTER — Telehealth: Payer: Self-pay | Admitting: Orthopedic Surgery

## 2021-03-14 DIAGNOSIS — M6281 Muscle weakness (generalized): Secondary | ICD-10-CM

## 2021-03-14 DIAGNOSIS — R600 Localized edema: Secondary | ICD-10-CM

## 2021-03-14 DIAGNOSIS — M25562 Pain in left knee: Secondary | ICD-10-CM

## 2021-03-14 DIAGNOSIS — M25662 Stiffness of left knee, not elsewhere classified: Secondary | ICD-10-CM | POA: Diagnosis not present

## 2021-03-14 DIAGNOSIS — R2689 Other abnormalities of gait and mobility: Secondary | ICD-10-CM

## 2021-03-14 MED ORDER — OXYCODONE HCL 5 MG PO TABS: 5.0000 mg | ORAL_TABLET | ORAL | 0 refills | Status: DC | PRN

## 2021-03-14 NOTE — Telephone Encounter (Signed)
Pt called stating he had an appt on 03/13/21 and was supposed to have oxycodone 5 mg called in but it was never sent. Pt would like this to go to his [facility] pharmacy and would like to be notified afterward.   [phone]

## 2021-03-14 NOTE — Telephone Encounter (Signed)
Pt is s/p a left total knee 02/27/21 please see message below.

## 2021-03-14 NOTE — Therapy (Signed)
[facility] [address] [place], [place], [address] Phone: [phone]   Fax:  [phone]  Physical Therapy Treatment  Patient Details  Name: Travis White MRN: [id number] Date of Birth: 25-Sep-1961 Referring Provider (PT): Dr Aldean Baker   Encounter Date: 03/14/2021   PT End of Session - 03/14/21 1433    Visit Number 2    Number of Visits 12    Date for PT Re-Evaluation 04/22/21    Authorization Type BCBS    PT Start Time 1426    PT Stop Time 1515    PT Time Calculation (min) 49 min    Activity Tolerance Patient tolerated treatment well    Behavior During Therapy [facility] for tasks assessed/performed           Past Medical History:  Diagnosis Date  . Arthritis   . Diabetes mellitus    type II    Past Surgical History:  Procedure Laterality Date  . Left elbow Left   . TOTAL KNEE ARTHROPLASTY Left 02/27/2021   Procedure: LEFT TOTAL KNEE ARTHROPLASTY;  Surgeon: Nadara Mustard, MD;  Location: [facility] OR;  Service: Orthopedics;  Laterality: Left;    There were no vitals filed for this visit.   Subjective Assessment - 03/14/21 1431    Subjective Pt. indicated getting staples removed and feeling like that felt better.  Pt. indicated back of knee complaints at 5/10 constant today.  Doing home exercises and icing.    Patient Stated Goals return to fishing and work    Currently in Pain? Yes    Pain Score 5     Pain Location Knee    Pain Orientation Left    Pain Descriptors / Indicators Aching;Tightness;Sore    Pain Type Surgical pain    Pain Onset 1 to 4 weeks ago    Pain Frequency Constant    Aggravating Factors  nothing specific was reported    Pain Relieving Factors ice, medicine                             [facility] Adult PT Treatment/Exercise - 03/14/21 0001      Knee/Hip Exercises: Stretches   Passive Hamstring Stretch 30 seconds;5 reps;Left   c strap   Gastroc Stretch 30 seconds;5 reps   runner stretch on incline  board c extension focus   Other Knee/Hip Stretches supine heel propin elevation stretch for extension with vaso application      Knee/Hip Exercises: Aerobic   Nustep Lvl 5 6 mins      Knee/Hip Exercises: Seated   Other Seated Knee/Hip Exercises LAQ/knee flexion alternating 2 sec holds 2 x 10 (contralateral leg movement opposite)      Knee/Hip Exercises: Supine   Other Supine Knee/Hip Exercises supine heel slide to quad set to slr x 10      Vasopneumatic   Number Minutes Vasopneumatic  10 minutes   heel prop in elevation   Vasopnuematic Location  Knee    Vasopneumatic Pressure Medium    Vasopneumatic Temperature  34*      Manual Therapy   Manual therapy comments seated Lt knee flexion, distraction, IR mobilization c movement                  PT Education - 03/14/21 1439    Education Details Printed HEP through [facility] for clinic use in future    Person(s) Educated Patient    Methods Explanation;Demonstration;Verbal cues;Handout  Comprehension Verbalized understanding;Returned demonstration            PT Short Term Goals - 03/14/21 1433      PT SHORT TERM GOAL #1   Title I with HEP to include standing balance work    Time 3    Period Weeks    Status On-going    Target Date 04/01/21      PT SHORT TERM GOAL #2   Title improve Lt knee flexion =/> 95 degrees to allow ease with stairs    Time 3    Period Weeks    Status On-going    Target Date 04/01/21      PT SHORT TERM GOAL #3   Title demo good Lt quad contraction to eliminate quad lag with SLR    Time 3    Period Weeks    Status On-going    Target Date 04/01/21      PT SHORT TERM GOAL #4   Title ambulate without assistive device on even surfaces with good form    Time 3    Period Weeks    Status On-going    Target Date 04/01/21             PT Long Term Goals - 03/11/21 1003      PT LONG TERM GOAL #1   Title I with advanced HEP    Time 6    Period Weeks    Status New    Target Date  04/22/21      PT LONG TERM GOAL #2   Title improve Lt knee ROM to -2 extension and 115 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    Time 6    Period Weeks    Status New    Target Date 04/22/21      PT LONG TERM GOAL #3   Title demo Lt LE strength =/> 5/5 to allow return to normal acitivity    Time 6    Period Weeks    Status New    Target Date 04/22/21      PT LONG TERM GOAL #4   Title improve FOTO =/> 60    Time 6    Period Weeks    Status New    Target Date 04/22/21      PT LONG TERM GOAL #5   Title demo good balance to tolerate fishing on a boat    Time 6    Period Weeks    Status New    Target Date 04/22/21                 Plan - 03/14/21 1436    Clinical Impression Statement After review today, printed exercise program with focus point on gaining active and passive end range to reach mobility goals as well as improving quad activation/strength (some overlap to previous home health plan).  Importance for long duration stretching into TKE as well quad activation noted today.    Personal Factors and Comorbidities Comorbidity 2;Profession    Comorbidities DM, Lt elbow fx,    Examination-Activity Limitations Bathing;Carry;Squat;Stairs;Stand    Examination-Participation Restrictions Driving;Occupation;Other    Stability/Clinical Decision Making Stable/Uncomplicated    Rehab Potential Excellent    PT Frequency 2x / week    PT Duration 6 weeks    PT Treatment/Interventions Cryotherapy;Electrical Stimulation;Moist Heat;Gait training;Stair training;Functional mobility training;Therapeutic exercise;Balance training;Neuromuscular re-education;Manual techniques;Dry needling;Passive range of motion;Patient/family education;Taping;Vasopneumatic Device    PT Next Visit Plan Work with manual and  ther ex for improved extension/flexion mobility, quad strengthening c progression to WB loading, early static non balance intervention continued.    PT Home Exercise Plan [email or web address]     Consulted and Agree with Plan of Care Patient           Patient will benefit from skilled therapeutic intervention in order to improve the following deficits and impairments:  Abnormal gait,Decreased range of motion,Difficulty walking,Obesity,Pain,Decreased balance,Decreased strength  Visit Diagnosis: Acute pain of left knee  Stiffness of left knee, not elsewhere classified  Muscle weakness (generalized)  Other abnormalities of gait and mobility  Localized edema     Problem List Patient Active Problem List   Diagnosis Date Noted  . Arthritis of left knee 02/27/2021  . Unilateral primary osteoarthritis, left knee     Chyrel Masson, PT, DPT, OCS, ATC 03/14/21  3:05 PM    [facility] [address] [place], [place], [address] Phone: [phone]   Fax:  [phone]  Name: Travis White MRN: [id number] Date of Birth: 1960/12/22

## 2021-03-14 NOTE — Telephone Encounter (Signed)
My fault just forgot. Done

## 2021-03-14 NOTE — Patient Instructions (Addendum)
Access Code: [email or web address] URL: [email or web address] Date: 03/14/2021 Prepared by: Chyrel Masson  Exercises Small Range Straight Leg Raise - 2 x daily - 7 x weekly - 3 sets - 10 reps Supine Heel Slide - 2 x daily - 7 x weekly - 3 sets - 10 reps - 2 hold Seated Long Arc Quad - 2 x daily - 7 x weekly - 3 sets - 10 reps - 2 hold Supine Knee Extension Mobilization with Weight - 4 x daily - 7 x weekly - 1 sets - 1 reps - up to 15 mins hold

## 2021-03-18 NOTE — Addendum Note (Signed)
Addended by: Trae Bovenzi, Darl Pikes E on: 03/18/2021 09:33 AM   Modules accepted: Orders

## 2021-03-19 ENCOUNTER — Other Ambulatory Visit: Payer: Self-pay

## 2021-03-19 ENCOUNTER — Ambulatory Visit (INDEPENDENT_AMBULATORY_CARE_PROVIDER_SITE_OTHER): Payer: BC Managed Care – PPO | Admitting: Rehabilitative and Restorative Service Providers"

## 2021-03-19 ENCOUNTER — Encounter: Payer: Self-pay | Admitting: Rehabilitative and Restorative Service Providers"

## 2021-03-19 DIAGNOSIS — R600 Localized edema: Secondary | ICD-10-CM

## 2021-03-19 DIAGNOSIS — M25562 Pain in left knee: Secondary | ICD-10-CM

## 2021-03-19 DIAGNOSIS — M25662 Stiffness of left knee, not elsewhere classified: Secondary | ICD-10-CM

## 2021-03-19 DIAGNOSIS — R2689 Other abnormalities of gait and mobility: Secondary | ICD-10-CM | POA: Diagnosis not present

## 2021-03-19 DIAGNOSIS — M6281 Muscle weakness (generalized): Secondary | ICD-10-CM

## 2021-03-19 NOTE — Therapy (Signed)
[facility] Physical Therapy [address] [place], [place], [address] Phone: [phone]   Fax:  [phone]  Physical Therapy Treatment  Patient Details  Name: Travis White MRN: [id number] Date of Birth: [birth year] Referring Provider (PT): Dr Aldean Baker   Encounter Date: 03/19/2021   PT End of Session - 03/19/21 1201    Visit Number 3    Number of Visits 12    Date for PT Re-Evaluation 04/22/21    Authorization Type BCBS    PT Start Time 1156    PT Stop Time 1226    PT Time Calculation (min) 30 min    Activity Tolerance Patient tolerated treatment well    Behavior During Therapy [facility] for tasks assessed/performed           Past Medical History:  Diagnosis Date  . Arthritis   . Diabetes mellitus    type II    Past Surgical History:  Procedure Laterality Date  . Left elbow Left   . TOTAL KNEE ARTHROPLASTY Left 02/27/2021   Procedure: LEFT TOTAL KNEE ARTHROPLASTY;  Surgeon: Nadara Mustard, MD;  Location: [facility] OR;  Service: Orthopedics;  Laterality: Left;    There were no vitals filed for this visit.   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  Pt. stated not much pain today, had pain when waking up last night c leg off bed when he woke up.    Patient Stated Goals return to fishing and work    Currently in Pain? 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, in middle of night pain c leg hanging off bed    Pain Relieving Factors rest, exercise movements helps stiffness                             [facility] Adult PT Treatment/Exercise - 03/19/21 0001      Knee/Hip Exercises: Stretches   Gastroc Stretch 3 reps;Left;30 seconds    runner stretch on incline board Lt heel on ground     Knee/Hip Exercises: Aerobic   Nustep Lvl 6 6 mins, seat #4      Knee/Hip Exercises: Seated   Other Seated Knee/Hip Exercises LAQ/knee flexion alternating 2 sec holds 3 x 10 (contralateral leg movement opposite)    Other Seated Knee/Hip Exercises seated SLR Lt 2 x 10 c quad set initation focus prior to movement    Sit to Sand without UE support;2 sets;10 reps   18 inch chair, slow eccentric lowering focus                   PT Short Term Goals - 03/14/21 1433      PT SHORT TERM GOAL #1   Title I with HEP to include standing balance work    Time 3    Period Weeks    Status On-going    Target Date 04/01/21      PT SHORT TERM GOAL #2   Title improve Lt knee flexion =/> 95 degrees to allow ease with stairs    Time  3    Period Weeks    Status On-going    Target Date 04/01/21      PT SHORT TERM GOAL #3   Title demo good Lt quad contraction to eliminate quad lag with SLR    Time 3    Period Weeks    Status On-going    Target Date 04/01/21      PT SHORT TERM GOAL #4   Title ambulate without assistive device on even surfaces with good form    Time 3    Period Weeks    Status On-going    Target Date 04/01/21             PT Long Term Goals - 03/11/21 1003      PT LONG TERM GOAL #1   Title I with advanced HEP    Time 6    Period Weeks    Status New    Target Date 04/22/21      PT LONG TERM GOAL #2   Title improve Lt knee ROM to -2 extension and 115 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    Time 6    Period Weeks    Status New    Target Date 04/22/21      PT LONG TERM GOAL #3   Title demo Lt LE strength =/> 5/5 to allow return to normal acitivity    Time 6    Period Weeks    Status New    Target Date 04/22/21      PT LONG TERM GOAL #4   Title improve FOTO =/> 60    Time 6    Period Weeks    Status New    Target Date 04/22/21      PT LONG TERM GOAL #5   Title demo good balance to  tolerate fishing on a boat    Time 6    Period Weeks    Status New    Target Date 04/22/21                 Plan - 03/19/21 1212    Clinical Impression Statement Some reduction in treatment time today due arrival time later.  Overall demonstrated improved tolerance to flexion movement today in activity.  Extension LAQ/restriction in passive extension still noted and continue to be focus point in HEP and in clinic activity.  SPC use continued today.    Personal Factors and Comorbidities Comorbidity 2;Profession    Comorbidities DM, Lt elbow fx,    Examination-Activity Limitations Bathing;Carry;Squat;Stairs;Stand    Examination-Participation Restrictions Driving;Occupation;Other    Stability/Clinical Decision Making Stable/Uncomplicated    Rehab Potential Excellent    PT Frequency 2x / week    PT Duration 6 weeks    PT Treatment/Interventions Cryotherapy;Electrical Stimulation;Moist Heat;Gait training;Stair training;Functional mobility training;Therapeutic exercise;Balance training;Neuromuscular re-education;Manual techniques;Dry needling;Passive range of motion;Patient/family education;Taping;Vasopneumatic Device    PT Next Visit Plan Work with manual and ther ex for improved extension/flexion mobility, quad strengthening c progression to WB loading, early static non balance intervention continued.    PT Home Exercise Plan [email or web address]    Consulted and Agree with Plan of Care Patient           Patient will benefit from skilled therapeutic intervention in order to improve the following deficits and impairments:  Abnormal gait,Decreased range of motion,Difficulty walking,Obesity,Pain,Decreased balance,Decreased strength  Visit Diagnosis: Acute pain of left knee  Stiffness of left knee, not elsewhere classified  Muscle weakness (generalized)  Other abnormalities of gait and mobility  Localized edema     Problem List Patient Active Problem List   Diagnosis Date Noted  .  Arthritis of left knee 02/27/2021  . Unilateral primary osteoarthritis, left knee    Chyrel Masson, PT, DPT, OCS, ATC 03/19/21  12:18 PM    [facility] Physical Therapy [address] [place], [place], [address] Phone: [phone]   Fax:  [phone]  Name: Travis White MRN: [id number] Date of Birth: 1961/02/06

## 2021-03-22 ENCOUNTER — Other Ambulatory Visit: Payer: Self-pay

## 2021-03-22 ENCOUNTER — Ambulatory Visit (INDEPENDENT_AMBULATORY_CARE_PROVIDER_SITE_OTHER): Payer: BC Managed Care – PPO | Admitting: Rehabilitative and Restorative Service Providers"

## 2021-03-22 ENCOUNTER — Encounter: Payer: Self-pay | Admitting: Rehabilitative and Restorative Service Providers"

## 2021-03-22 DIAGNOSIS — R2689 Other abnormalities of gait and mobility: Secondary | ICD-10-CM

## 2021-03-22 DIAGNOSIS — M6281 Muscle weakness (generalized): Secondary | ICD-10-CM

## 2021-03-22 DIAGNOSIS — R600 Localized edema: Secondary | ICD-10-CM

## 2021-03-22 DIAGNOSIS — M25562 Pain in left knee: Secondary | ICD-10-CM | POA: Diagnosis not present

## 2021-03-22 DIAGNOSIS — M25662 Stiffness of left knee, not elsewhere classified: Secondary | ICD-10-CM | POA: Diagnosis not present

## 2021-03-22 NOTE — Therapy (Signed)
[facility] Physical Therapy [address] [place], [place], [address] Phone: [phone]   Fax:  [phone]  Physical Therapy Treatment  Patient Details  Name: Travis White MRN: [id number] Date of Birth: Apr 04, 1961 Referring Provider (PT): Dr Aldean Baker   Encounter Date: 03/22/2021   PT End of Session - 03/22/21 1022    Visit Number 4    Number of Visits 12    Date for PT Re-Evaluation 04/22/21    Authorization Type BCBS    PT Start Time 1022    PT Stop Time 1100    PT Time Calculation (min) 38 min    Activity Tolerance Patient tolerated treatment well    Behavior During Therapy [facility] for tasks assessed/performed           Past Medical History:  Diagnosis Date  . Arthritis   . Diabetes mellitus    type II    Past Surgical History:  Procedure Laterality Date  . Left elbow Left   . TOTAL KNEE ARTHROPLASTY Left 02/27/2021   Procedure: LEFT TOTAL KNEE ARTHROPLASTY;  Surgeon: Nadara Mustard, MD;  Location: [facility] OR;  Service: Orthopedics;  Laterality: Left;    There were no vitals filed for this visit.   Subjective Assessment - 03/22/21 1026    Subjective Pt. indicated leg felt stiff today as primary complaint.    Patient Stated Goals return to fishing and work    Pain Score 7    at worst   Pain Location Knee    Pain Orientation Left    Pain Descriptors / Indicators Aching;Tightness;Sore    Pain Onset 1 to 4 weeks ago    Pain Frequency Constant    Aggravating Factors  walking, lying down    Pain Relieving Factors lying on Rt side vs. Lt side for bed complaints              [facility] PT Assessment - 03/22/21 0001      Assessment   Medical Diagnosis Lt TKA    Referring Provider (PT) Dr Aldean Baker    Onset Date/Surgical Date 02/27/21    Hand Dominance Right    Next MD Visit 03/13/2021      AROM   Left Knee Extension -6   in supine quad set  (LAQ= -15)   Left Knee Flexion 95   in supine heel slide     Ambulation/Gait   Gait Comments SPC  use to and from clinic, in clinic                         [facility] Adult PT Treatment/Exercise - 03/22/21 0001      Knee/Hip Exercises: Stretches   Gastroc Stretch Left;30 seconds;3 reps   runner stretch Lt leg posterior incline board     Knee/Hip Exercises: Aerobic   Recumbent Bike Partial revolutions seat 1 6 mins      Knee/Hip Exercises: Machines for Strengthening   Total Gym Leg Press Lt leg 3 x 10 37 lbs (machine set on most bending)      Knee/Hip Exercises: Standing   Forward Step Up 2 sets;10 reps;Step Height: 4";Left   TKE upon step up,  green band     Knee/Hip Exercises: Seated   Other Seated Knee/Hip Exercises LAQ/knee flexion alternating 2 sec holds 3 x 10 (contralateral leg movement opposite)      Knee/Hip Exercises: Supine   Other Supine Knee/Hip Exercises supine heel slide to quad set to slr x  10                    PT Short Term Goals - 03/14/21 1433      PT SHORT TERM GOAL #1   Title I with HEP to include standing balance work    Time 3    Period Weeks    Status On-going    Target Date 04/01/21      PT SHORT TERM GOAL #2   Title improve Lt knee flexion =/> 95 degrees to allow ease with stairs    Time 3    Period Weeks    Status On-going    Target Date 04/01/21      PT SHORT TERM GOAL #3   Title demo good Lt quad contraction to eliminate quad lag with SLR    Time 3    Period Weeks    Status On-going    Target Date 04/01/21      PT SHORT TERM GOAL #4   Title ambulate without assistive device on even surfaces with good form    Time 3    Period Weeks    Status On-going    Target Date 04/01/21             PT Long Term Goals - 03/11/21 1003      PT LONG TERM GOAL #1   Title I with advanced HEP    Time 6    Period Weeks    Status New    Target Date 04/22/21      PT LONG TERM GOAL #2   Title improve Lt knee ROM to -2 extension and 115 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    Time 6    Period Weeks     Status New    Target Date 04/22/21      PT LONG TERM GOAL #3   Title demo Lt LE strength =/> 5/5 to allow return to normal acitivity    Time 6    Period Weeks    Status New    Target Date 04/22/21      PT LONG TERM GOAL #4   Title improve FOTO =/> 60    Time 6    Period Weeks    Status New    Target Date 04/22/21      PT LONG TERM GOAL #5   Title demo good balance to tolerate fishing on a boat    Time 6    Period Weeks    Status New    Target Date 04/22/21                 Plan - 03/22/21 1052    Clinical Impression Statement Update of mobility measurements today revealed good overal improvement in flexion actively at this time.  Extension active and passive restriction still noted but making slower gains.  Continued work in active quad activation and passive extension in long duration stretching while working on flexion indicated.    Personal Factors and Comorbidities Comorbidity 2;Profession    Comorbidities DM, Lt elbow fx,    Examination-Activity Limitations Bathing;Carry;Squat;Stairs;Stand    Examination-Participation Restrictions Driving;Occupation;Other    Stability/Clinical Decision Making Stable/Uncomplicated    Rehab Potential Excellent    PT Frequency 2x / week    PT Duration 6 weeks    PT Treatment/Interventions Cryotherapy;Electrical Stimulation;Moist Heat;Gait training;Stair training;Functional mobility training;Therapeutic exercise;Balance training;Neuromuscular re-education;Manual techniques;Dry needling;Passive range of motion;Patient/family education;Taping;Vasopneumatic Device    PT Next Visit Plan  Work with manual and ther ex for improved extension/flexion mobility, quad strengthening c progression to WB loading, early static non balance intervention continued.    PT Home Exercise Plan [email or web address]    Consulted and Agree with Plan of Care Patient           Patient will benefit from skilled therapeutic intervention in order to improve the following  deficits and impairments:  Abnormal gait,Decreased range of motion,Difficulty walking,Obesity,Pain,Decreased balance,Decreased strength  Visit Diagnosis: Acute pain of left knee  Stiffness of left knee, not elsewhere classified  Muscle weakness (generalized)  Other abnormalities of gait and mobility  Localized edema     Problem List Patient Active Problem List   Diagnosis Date Noted  . Arthritis of left knee 02/27/2021  . Unilateral primary osteoarthritis, left knee    Chyrel Masson, PT, DPT, OCS, ATC 03/22/21  10:54 AM    [facility] Physical Therapy [address] [place], [place], [address] Phone: [phone]   Fax:  [phone]  Name: Travis White MRN: [id number] Date of Birth: 1961/05/25

## 2021-03-26 ENCOUNTER — Other Ambulatory Visit: Payer: Self-pay

## 2021-03-26 ENCOUNTER — Ambulatory Visit (INDEPENDENT_AMBULATORY_CARE_PROVIDER_SITE_OTHER): Payer: BC Managed Care – PPO | Admitting: Physician Assistant

## 2021-03-26 ENCOUNTER — Encounter: Payer: Self-pay | Admitting: Rehabilitative and Restorative Service Providers"

## 2021-03-26 ENCOUNTER — Ambulatory Visit (INDEPENDENT_AMBULATORY_CARE_PROVIDER_SITE_OTHER): Payer: BC Managed Care – PPO | Admitting: Rehabilitative and Restorative Service Providers"

## 2021-03-26 ENCOUNTER — Encounter: Payer: Self-pay | Admitting: Physician Assistant

## 2021-03-26 DIAGNOSIS — R2689 Other abnormalities of gait and mobility: Secondary | ICD-10-CM

## 2021-03-26 DIAGNOSIS — M25562 Pain in left knee: Secondary | ICD-10-CM

## 2021-03-26 DIAGNOSIS — M25662 Stiffness of left knee, not elsewhere classified: Secondary | ICD-10-CM | POA: Diagnosis not present

## 2021-03-26 DIAGNOSIS — M6281 Muscle weakness (generalized): Secondary | ICD-10-CM

## 2021-03-26 DIAGNOSIS — R600 Localized edema: Secondary | ICD-10-CM

## 2021-03-26 DIAGNOSIS — G8929 Other chronic pain: Secondary | ICD-10-CM

## 2021-03-26 MED ORDER — OXYCODONE HCL 5 MG PO TABS: 5.0000 mg | ORAL_TABLET | ORAL | 0 refills | Status: DC | PRN

## 2021-03-26 NOTE — Progress Notes (Signed)
Office Visit Note   Patient: Travis White           Date of Birth: 09-27-1961           MRN: [id number] Visit Date: 03/26/2021              Requested by: No referring provider defined for this encounter. PCP: Patient, No Pcp Per (Inactive)  Chief Complaint  Patient presents with  . Left Knee - Routine Post Op      HPI: Patient is a pleasant 60 year old gentleman who is 4 weeks status post left total knee arthroplasty.  He is doing well and has been working with outpatient physical therapy.  He feels the swelling is going down in his leg.  He is requesting another refill of his pain medicine.  Assessment & Plan: Visit Diagnoses: No diagnosis found.  Plan: Continue with outpatient physical therapy follow-up in 1 month  Follow-Up Instructions: No follow-ups on file.   Ortho Exam  Patient is alert, oriented, no adenopathy, well-dressed, normal affect, normal respiratory effort. Examination of his left knee well-healed surgical incision.  Just small eschar at the bottom but no drainage no surrounding erythema.  Compartments are compressible he does have some swelling in the calf but no redness no cellulitis.  Nontender to deep palpation over the greater saphenous.  Negative Homans' sign.  Swelling is actually less than at his last visit.  He has flexion to 105 degrees he is lacking a few degrees of full extension no effusion or cellulitis about the knee  Imaging: No results found. No images are attached to the encounter.  Labs: Lab Results  Component Value Date   HGBA1C 7.4 (H) 02/27/2021   HGBA1C (H) 12/12/2010    8.7 (NOTE)                                                                       According to the ADA Clinical Practice Recommendations for 2011, when HbA1c is used as a screening test:   >=6.5%   Diagnostic of Diabetes Mellitus           (if abnormal result  is confirmed)  5.7-6.4%   Increased risk of developing Diabetes Mellitus  References:Diagnosis and  Classification of Diabetes Mellitus,Diabetes Care,2011,34(Suppl 1):S62-S69 and Standards of Medical Care in         Diabetes - 2011,Diabetes Care,2011,34  (Suppl 1):S11-S61.     No results found for: ALBUMIN, PREALBUMIN, CBC  No results found for: MG No results found for: VD25OH  No results found for: PREALBUMIN CBC EXTENDED Latest Ref Rng & Units 02/25/2021 12/13/2010 12/12/2010  WBC 4.0 - 10.5 K/uL 11.4(H) - -  RBC 4.22 - 5.81 MIL/uL 5.12 - -  HGB 13.0 - 17.0 g/dL 02.3 11.0(L) 12.2(L)  HCT 39.0 - 52.0 % 45.6 33.2(L) 35.5(L)  PLT 150 - 400 K/uL 249 - -     There is no height or weight on file to calculate BMI.  Orders:  No orders of the defined types were placed in this encounter.  No orders of the defined types were placed in this encounter.    Procedures: No procedures performed  Clinical Data: No additional findings.  ROS:  All other systems negative, except  as noted in the HPI. Review of Systems  Objective: Vital Signs: There were no vitals taken for this visit.  Specialty Comments:  No specialty comments available.  PMFS History: Patient Active Problem List   Diagnosis Date Noted  . Arthritis of left knee 02/27/2021  . Unilateral primary osteoarthritis, left knee    Past Medical History:  Diagnosis Date  . Arthritis   . Diabetes mellitus    type II    No family history on file.  Past Surgical History:  Procedure Laterality Date  . Left elbow Left   . TOTAL KNEE ARTHROPLASTY Left 02/27/2021   Procedure: LEFT TOTAL KNEE ARTHROPLASTY;  Surgeon: Nadara Mustard, MD;  Location: [facility] OR;  Service: Orthopedics;  Laterality: Left;   Social History   Occupational History  . Not on file  Tobacco Use  . Smoking status: Current Every Day Smoker    Packs/day: 1.00    Types: Cigarettes  . Smokeless tobacco: Never Used  Vaping Use  . Vaping Use: Never used  Substance and Sexual Activity  . Alcohol use: No  . Drug use: No  . Sexual activity: Not on file

## 2021-03-26 NOTE — Therapy (Signed)
[facility] Physical Therapy [address] [place], [place], [address] Phone: [phone]   Fax:  [phone]  Physical Therapy Treatment  Patient Details  Name: Travis White MRN: [id number] Date of Birth: 06-06-1961 Referring Provider (PT): Dr Aldean Baker   Encounter Date: 03/26/2021   PT End of Session - 03/26/21 1147    Visit Number 5    Number of Visits 12    Date for PT Re-Evaluation 04/22/21    Authorization Type BCBS    PT Start Time 1143    PT Stop Time 1223    PT Time Calculation (min) 40 min    Activity Tolerance Patient tolerated treatment well    Behavior During Therapy [facility] for tasks assessed/performed           Past Medical History:  Diagnosis Date  . Arthritis   . Diabetes mellitus    type II    Past Surgical History:  Procedure Laterality Date  . Left elbow Left   . TOTAL KNEE ARTHROPLASTY Left 02/27/2021   Procedure: LEFT TOTAL KNEE ARTHROPLASTY;  Surgeon: Nadara Mustard, MD;  Location: [facility] OR;  Service: Orthopedics;  Laterality: Left;    There were no vitals filed for this visit.   Subjective Assessment - 03/26/21 1145    Subjective Pt. stated feeling about 5/10 or so today upon arrival, noted on front of knee.    Patient Stated Goals return to fishing and work    Currently in Pain? Yes    Pain Score 5     Pain Location Knee    Pain Orientation Left;Anterior    Pain Descriptors / Indicators Aching;Tightness;Sore    Pain Type Surgical pain    Pain Onset 1 to 4 weeks ago    Pain Frequency Constant    Aggravating Factors  prolonged WB, static positioning (stiffness)    Pain Relieving Factors light movement helps soreness.              [facility] PT Assessment - 03/26/21 0001      Assessment   Medical Diagnosis Lt TKA    Referring Provider (PT) Dr Aldean Baker    Onset Date/Surgical Date 02/27/21    Hand Dominance Right    Next MD Visit 03/13/2021      AROM   Overall AROM Comments Seated LAQ extension measurement Lt (  -23 degrees)    Left Knee Extension -5   in supine quad set   Left Knee Flexion 101   in supine heel slide     PROM   Left Knee Flexion 108   measured in seated     Ambulation/Gait   Gait Comments SPC use to and from clinic.  Able to ambulate independently in clinic s assistive device.  Antalgic gait mainly due to TKE limitations noted in stance                         [facility] Adult PT Treatment/Exercise - 03/26/21 0001      Neuro Re-ed    Neuro Re-ed Details  tandem ambulation 10 ft x 6 fwd, reverse, tandem stance 1 min x1 bilaterally      Knee/Hip Exercises: Stretches   Passive Hamstring Stretch 5 reps;30 seconds;Left    Gastroc Stretch 3 reps;Left   runner stretch on incline board     Knee/Hip Exercises: Aerobic   Recumbent Bike Partial revolutions seat 1 5 mins      Knee/Hip Exercises: Machines for Strengthening  Cybex Knee Extension eccentric Lt leg lowering 5 lbs x 15 c breaks (proceed slowly due to fatigue and difficulty)    Total Gym Leg Press Lt leg 3 x 10 37 lbs (machine set on most bending)      Knee/Hip Exercises: Standing   Forward Step Up 2 sets;Step Height: 4";Left;15 reps   TKE upon step up,  green band     Manual Therapy   Manual therapy comments seated Lt knee flexion, distraction, IR mobilization c movement                    PT Short Term Goals - 03/14/21 1433      PT SHORT TERM GOAL #1   Title I with HEP to include standing balance work    Time 3    Period Weeks    Status On-going    Target Date 04/01/21      PT SHORT TERM GOAL #2   Title improve Lt knee flexion =/> 95 degrees to allow ease with stairs    Time 3    Period Weeks    Status On-going    Target Date 04/01/21      PT SHORT TERM GOAL #3   Title demo good Lt quad contraction to eliminate quad lag with SLR    Time 3    Period Weeks    Status On-going    Target Date 04/01/21      PT SHORT TERM GOAL #4   Title ambulate without assistive device on even  surfaces with good form    Time 3    Period Weeks    Status On-going    Target Date 04/01/21             PT Long Term Goals - 03/11/21 1003      PT LONG TERM GOAL #1   Title I with advanced HEP    Time 6    Period Weeks    Status New    Target Date 04/22/21      PT LONG TERM GOAL #2   Title improve Lt knee ROM to -2 extension and 115 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    Time 6    Period Weeks    Status New    Target Date 04/22/21      PT LONG TERM GOAL #3   Title demo Lt LE strength =/> 5/5 to allow return to normal acitivity    Time 6    Period Weeks    Status New    Target Date 04/22/21      PT LONG TERM GOAL #4   Title improve FOTO =/> 60    Time 6    Period Weeks    Status New    Target Date 04/22/21      PT LONG TERM GOAL #5   Title demo good balance to tolerate fishing on a boat    Time 6    Period Weeks    Status New    Target Date 04/22/21                 Plan - 03/26/21 1209    Clinical Impression Statement Progression of quad activation and strengthening produced easy fatigue and increased difficulty on first trials (specifically knee extension).  Plan to proceed slowly based off symptom reactions/soreness in muscule.  Discussed c Pt. muscle soreness after exercise/strengthening.  Continued skilled PT services indicated at this time.  Personal Factors and Comorbidities Comorbidity 2;Profession    Comorbidities DM, Lt elbow fx,    Examination-Activity Limitations Bathing;Carry;Squat;Stairs;Stand    Examination-Participation Restrictions Driving;Occupation;Other    Stability/Clinical Decision Making Stable/Uncomplicated    Rehab Potential Excellent    PT Frequency 2x / week    PT Duration 6 weeks    PT Treatment/Interventions Cryotherapy;Electrical Stimulation;Moist Heat;Gait training;Stair training;Functional mobility training;Therapeutic exercise;Balance training;Neuromuscular re-education;Manual techniques;Dry  needling;Passive range of motion;Patient/family education;Taping;Vasopneumatic Device    PT Next Visit Plan Work with manual and ther ex for improved extension/flexion mobility, quad strengthening c progression to WB loading, compliant surface intervention .    PT Home Exercise Plan [email or web address]    Consulted and Agree with Plan of Care Patient           Patient will benefit from skilled therapeutic intervention in order to improve the following deficits and impairments:  Abnormal gait,Decreased range of motion,Difficulty walking,Obesity,Pain,Decreased balance,Decreased strength  Visit Diagnosis: Acute pain of left knee  Stiffness of left knee, not elsewhere classified  Muscle weakness (generalized)  Other abnormalities of gait and mobility  Localized edema     Problem List Patient Active Problem List   Diagnosis Date Noted  . Arthritis of left knee 02/27/2021  . Unilateral primary osteoarthritis, left knee     Chyrel Masson, PT, DPT, OCS, ATC 03/26/21  12:29 PM    [facility] Physical Therapy [address] [place], [place], [address] Phone: [phone]   Fax:  [phone]  Name: Travis White MRN: [id number] Date of Birth: [birth year]

## 2021-03-28 ENCOUNTER — Ambulatory Visit (INDEPENDENT_AMBULATORY_CARE_PROVIDER_SITE_OTHER): Payer: BC Managed Care – PPO | Admitting: Rehabilitative and Restorative Service Providers"

## 2021-03-28 ENCOUNTER — Encounter: Payer: Self-pay | Admitting: Rehabilitative and Restorative Service Providers"

## 2021-03-28 ENCOUNTER — Other Ambulatory Visit: Payer: Self-pay

## 2021-03-28 DIAGNOSIS — M6281 Muscle weakness (generalized): Secondary | ICD-10-CM

## 2021-03-28 DIAGNOSIS — R600 Localized edema: Secondary | ICD-10-CM

## 2021-03-28 DIAGNOSIS — M25562 Pain in left knee: Secondary | ICD-10-CM | POA: Diagnosis not present

## 2021-03-28 DIAGNOSIS — M25662 Stiffness of left knee, not elsewhere classified: Secondary | ICD-10-CM | POA: Diagnosis not present

## 2021-03-28 DIAGNOSIS — R2689 Other abnormalities of gait and mobility: Secondary | ICD-10-CM | POA: Diagnosis not present

## 2021-03-28 NOTE — Therapy (Signed)
[facility] Physical Therapy [address] [place], [place], [address] Phone: [phone]   Fax:  [phone]  Physical Therapy Treatment  Patient Details  Name: Travis White MRN: [id number] Date of Birth: 03-25-1961 Referring Provider (PT): Dr Aldean Baker   Encounter Date: 03/28/2021   PT End of Session - 03/28/21 1305    Visit Number 6    Number of Visits 12    Date for PT Re-Evaluation 04/22/21    Authorization Type BCBS    PT Start Time 1301    PT Stop Time 1341    PT Time Calculation (min) 40 min    Activity Tolerance Patient tolerated treatment well    Behavior During Therapy [facility] for tasks assessed/performed           Past Medical History:  Diagnosis Date  . Arthritis   . Diabetes mellitus    type II    Past Surgical History:  Procedure Laterality Date  . Left elbow Left   . TOTAL KNEE ARTHROPLASTY Left 02/27/2021   Procedure: LEFT TOTAL KNEE ARTHROPLASTY;  Surgeon: Nadara Mustard, MD;  Location: [facility] OR;  Service: Orthopedics;  Laterality: Left;    There were no vitals filed for this visit.   Subjective Assessment - 03/28/21 1304    Subjective Pt. stated no pain, just tight with some soreness at times.    Patient Stated Goals return to fishing and work    Currently in Pain? No/denies    Pain Score 0-No pain    Pain Onset 1 to 4 weeks ago                             [facility] Adult PT Treatment/Exercise - 03/28/21 0001      Neuro Re-ed    Neuro Re-ed Details  SLS c moderate hand assist for corrections 20 sec x 5 bilateral,  rhythmic stabilizations Lt knee ext/flexion c mild to moderate resistances 5 sec holds 1 min x 2      Knee/Hip Exercises: Stretches   Gastroc Stretch 3 reps;30 seconds;Left   runner stretch incline board     Knee/Hip Exercises: Aerobic   Recumbent Bike Partial revolutions seat 1 5 mins      Knee/Hip Exercises: Machines for Strengthening   Cybex Knee Extension eccentric Lt leg lowering 5 lbs 2 x  10    Total Gym Leg Press 43 lbs 3 x 10 Lt Leg      Knee/Hip Exercises: Seated   Other Seated Knee/Hip Exercises seated SLR Lt 2 x 10 c quad set initation focus prior to movement      Manual Therapy   Manual therapy comments seated Lt knee flexion, distraction, IR mobilization c movement                    PT Short Term Goals - 03/14/21 1433      PT SHORT TERM GOAL #1   Title I with HEP to include standing balance work    Time 3    Period Weeks    Status On-going    Target Date 04/01/21      PT SHORT TERM GOAL #2   Title improve Lt knee flexion =/> 95 degrees to allow ease with stairs    Time 3    Period Weeks    Status On-going    Target Date 04/01/21      PT SHORT TERM GOAL #3   Title  demo good Lt quad contraction to eliminate quad lag with SLR    Time 3    Period Weeks    Status On-going    Target Date 04/01/21      PT SHORT TERM GOAL #4   Title ambulate without assistive device on even surfaces with good form    Time 3    Period Weeks    Status On-going    Target Date 04/01/21             PT Long Term Goals - 03/11/21 1003      PT LONG TERM GOAL #1   Title I with advanced HEP    Time 6    Period Weeks    Status New    Target Date 04/22/21      PT LONG TERM GOAL #2   Title improve Lt knee ROM to -2 extension and 115 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 to continue to benefit from skilled PT services to improve end range flexion mobility passive and actively, as well as improving quad strength to improve ambulation independence and functional mobility such as stairs up/down.    Personal Factors and Comorbidities Comorbidity 2;Profession    Comorbidities DM, Lt elbow fx,    Examination-Activity Limitations Bathing;Carry;Squat;Stairs;Stand    Examination-Participation Restrictions Driving;Occupation;Other    Stability/Clinical Decision Making Stable/Uncomplicated    Rehab Potential Excellent    PT Frequency 2x / week    PT Duration 6 weeks    PT Treatment/Interventions Cryotherapy;Electrical Stimulation;Moist Heat;Gait training;Stair training;Functional mobility training;Therapeutic exercise;Balance training;Neuromuscular re-education;Manual techniques;Dry needling;Passive range of motion;Patient/family education;Taping;Vasopneumatic Device    PT Next Visit Plan Work with manual and ther ex for improved extension/flexion mobility, quad strengthening c progression to WB loading, compliant surface intervention .    PT Home Exercise Plan [id number]    Consulted and Agree with Plan of Care Patient           Patient will benefit from skilled therapeutic intervention in order to improve the following deficits and impairments:  Abnormal gait,Decreased range of motion,Difficulty walking,Obesity,Pain,Decreased balance,Decreased strength  Visit Diagnosis: Acute pain of left knee  Stiffness of left knee, not elsewhere classified  Muscle weakness (generalized)  Other abnormalities of gait and mobility  Localized edema     Problem List Patient Active Problem List   Diagnosis Date Noted  . Arthritis of left knee 02/27/2021  . Unilateral primary osteoarthritis, left knee     Travis Needle  Delford White PT, DPT, OCS, ATC 03/28/21  1:42 PM    [facility] Physical Therapy [address] [place], [place], [address] Phone:  [phone]   Fax:  [phone]  Name: Travis White MRN: [id number] Date of Birth: May 23, 1961

## 2021-04-02 ENCOUNTER — Encounter: Payer: Self-pay | Admitting: Rehabilitative and Restorative Service Providers"

## 2021-04-02 ENCOUNTER — Other Ambulatory Visit: Payer: Self-pay

## 2021-04-02 ENCOUNTER — Ambulatory Visit (INDEPENDENT_AMBULATORY_CARE_PROVIDER_SITE_OTHER): Payer: BC Managed Care – PPO | Admitting: Rehabilitative and Restorative Service Providers"

## 2021-04-02 DIAGNOSIS — M6281 Muscle weakness (generalized): Secondary | ICD-10-CM

## 2021-04-02 DIAGNOSIS — M25662 Stiffness of left knee, not elsewhere classified: Secondary | ICD-10-CM

## 2021-04-02 DIAGNOSIS — M25562 Pain in left knee: Secondary | ICD-10-CM

## 2021-04-02 DIAGNOSIS — R2689 Other abnormalities of gait and mobility: Secondary | ICD-10-CM

## 2021-04-02 DIAGNOSIS — R600 Localized edema: Secondary | ICD-10-CM

## 2021-04-02 NOTE — Therapy (Signed)
[facility] Physical Therapy [address] [place], [place], [address] Phone: [phone]   Fax:  [phone]  Physical Therapy Treatment  Patient Details  Name: Travis White MRN: [id number] Date of Birth: Nov 15, 1960 Referring Provider (PT): Dr Aldean Baker   Encounter Date: 04/02/2021   PT End of Session - 04/02/21 1151    Visit Number 7    Number of Visits 12    Date for PT Re-Evaluation 04/22/21    Authorization Type BCBS    PT Start Time 1144    PT Stop Time 1223    PT Time Calculation (min) 39 min    Activity Tolerance Patient tolerated treatment well    Behavior During Therapy [facility] for tasks assessed/performed           Past Medical History:  Diagnosis Date  . Arthritis   . Diabetes mellitus    type II    Past Surgical History:  Procedure Laterality Date  . Left elbow Left   . TOTAL KNEE ARTHROPLASTY Left 02/27/2021   Procedure: LEFT TOTAL KNEE ARTHROPLASTY;  Surgeon: Nadara Mustard, MD;  Location: [facility] OR;  Service: Orthopedics;  Laterality: Left;    There were no vitals filed for this visit.   Subjective Assessment - 04/02/21 1149    Subjective Pt. indicated mild to moderate complaints of symptoms, sometimes 3-5/10 in shin.    Patient Stated Goals return to fishing and work    Currently in Pain? Yes    Pain Score 5     Pain Location Knee   to shin   Pain Orientation Left;Anterior    Pain Descriptors / Indicators Sore;Aching    Pain Type Surgical pain    Pain Onset 1 to 4 weeks ago    Pain Frequency Constant    Aggravating Factors  prolonged WB    Pain Relieving Factors rest if any help for shin soreness                             [facility] Adult PT Treatment/Exercise - 04/02/21 0001      Knee/Hip Exercises: Stretches   Passive Hamstring Stretch 5 reps;30 seconds;Left    Other Knee/Hip Stretches seated self flexion overpressure 1 min x 3      Knee/Hip Exercises: Aerobic   Recumbent Bike Partial revolutions 6  mins lvl 1 (1 instances of going around reverse with pain noted)      Knee/Hip Exercises: Machines for Strengthening   Cybex Knee Extension eccentric Lt leg lowering 10 lbs 3 x 10    Cybex Knee Flexion Lt single leg 10 lbs 3 x 10      Knee/Hip Exercises: Standing   Lateral Step Up Both;2 sets;10 reps;Step Height: 4"   eccentric lowering control     Knee/Hip Exercises: Supine   Other Supine Knee/Hip Exercises supine heel slide to quad set to slr x 10                    PT Short Term Goals - 03/14/21 1433      PT SHORT TERM GOAL #1   Title I with HEP to include standing balance work    Time 3    Period Weeks    Status On-going    Target Date 04/01/21      PT SHORT TERM GOAL #2   Title improve Lt knee flexion =/> 95 degrees to allow ease with stairs    Time  3    Period Weeks    Status On-going    Target Date 04/01/21      PT SHORT TERM GOAL #3   Title demo good Lt quad contraction to eliminate quad lag with SLR    Time 3    Period Weeks    Status On-going    Target Date 04/01/21      PT SHORT TERM GOAL #4   Title ambulate without assistive device on even surfaces with good form    Time 3    Period Weeks    Status On-going    Target Date 04/01/21             PT Long Term Goals - 03/11/21 1003      PT LONG TERM GOAL #1   Title I with advanced HEP    Time 6    Period Weeks    Status New    Target Date 04/22/21      PT LONG TERM GOAL #2   Title improve Lt knee ROM to -2 extension and 115 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    Time 6    Period Weeks    Status New    Target Date 04/22/21      PT LONG TERM GOAL #3   Title demo Lt LE strength =/> 5/5 to allow return to normal acitivity    Time 6    Period Weeks    Status New    Target Date 04/22/21      PT LONG TERM GOAL #4   Title improve FOTO =/> 60    Time 6    Period Weeks    Status New    Target Date 04/22/21      PT LONG TERM GOAL #5   Title demo good balance to tolerate  fishing on a boat    Time 6    Period Weeks    Status New    Target Date 04/22/21                 Plan - 04/02/21 1215    Clinical Impression Statement Continued gains noted in flexion mobility.  Extension limited by capsular and hamstring myofascial limitations that can be continually addressed c HEP and also in clinic stretching for mobility as well as active mobility interventions.  SPC use still noted at this time c deviation in gait mainly due to maitaining knee flexion in stance.    Personal Factors and Comorbidities Comorbidity 2;Profession    Comorbidities DM, Lt elbow fx,    Examination-Activity Limitations Bathing;Carry;Squat;Stairs;Stand    Examination-Participation Restrictions Driving;Occupation;Other    Stability/Clinical Decision Making Stable/Uncomplicated    Rehab Potential Excellent    PT Frequency 2x / week    PT Duration 6 weeks    PT Treatment/Interventions Cryotherapy;Electrical Stimulation;Moist Heat;Gait training;Stair training;Functional mobility training;Therapeutic exercise;Balance training;Neuromuscular re-education;Manual techniques;Dry needling;Passive range of motion;Patient/family education;Taping;Vasopneumatic Device    PT Next Visit Plan Work with manual and ther ex for improved extension/flexion mobility, quad strengthening c progression to WB loading, compliant surface intervention as able    PT Home Exercise Plan [email or web address]    Consulted and Agree with Plan of Care Patient           Patient will benefit from skilled therapeutic intervention in order to improve the following deficits and impairments:  Abnormal gait,Decreased range of motion,Difficulty walking,Obesity,Pain,Decreased balance,Decreased strength  Visit Diagnosis: Acute pain of left knee  Stiffness of left knee,  not elsewhere classified  Muscle weakness (generalized)  Other abnormalities of gait and mobility  Localized edema     Problem List Patient Active Problem List    Diagnosis Date Noted  . Arthritis of left knee 02/27/2021  . Unilateral primary osteoarthritis, left knee    Chyrel Masson, PT, DPT, OCS, ATC 04/02/21  12:22 PM    [facility] Physical Therapy [address] [place], [place], [address] Phone: [phone]   Fax:  [phone]  Name: Travis White MRN: [id number] Date of Birth: [birth year]

## 2021-04-04 ENCOUNTER — Other Ambulatory Visit: Payer: Self-pay

## 2021-04-04 ENCOUNTER — Ambulatory Visit (INDEPENDENT_AMBULATORY_CARE_PROVIDER_SITE_OTHER): Payer: BC Managed Care – PPO | Admitting: Rehabilitative and Restorative Service Providers"

## 2021-04-04 ENCOUNTER — Encounter: Payer: Self-pay | Admitting: Rehabilitative and Restorative Service Providers"

## 2021-04-04 DIAGNOSIS — M6281 Muscle weakness (generalized): Secondary | ICD-10-CM

## 2021-04-04 DIAGNOSIS — R600 Localized edema: Secondary | ICD-10-CM

## 2021-04-04 DIAGNOSIS — M25562 Pain in left knee: Secondary | ICD-10-CM | POA: Diagnosis not present

## 2021-04-04 DIAGNOSIS — R2689 Other abnormalities of gait and mobility: Secondary | ICD-10-CM

## 2021-04-04 DIAGNOSIS — M25662 Stiffness of left knee, not elsewhere classified: Secondary | ICD-10-CM

## 2021-04-04 NOTE — Therapy (Signed)
[facility] Physical Therapy [address] [place], [place], [address] Phone: [phone]   Fax:  [phone]  Physical Therapy Treatment  Patient Details  Name: Travis White MRN: [id number] Date of Birth: [birth year] Referring Provider (PT): Dr Meridee Score   Encounter Date: 04/04/2021   PT End of Session - 04/04/21 1140    Visit Number 8    Number of Visits 12    Date for PT Re-Evaluation 04/22/21    Authorization Type BCBS    PT Start Time 1136    PT Stop Time 1215    PT Time Calculation (min) 39 min    Activity Tolerance Patient tolerated treatment well    Behavior During Therapy [facility] for tasks assessed/performed           Past Medical History:  Diagnosis Date  . Arthritis   . Diabetes mellitus    type II    Past Surgical History:  Procedure Laterality Date  . Left elbow Left   . TOTAL KNEE ARTHROPLASTY Left 02/27/2021   Procedure: LEFT TOTAL KNEE ARTHROPLASTY;  Surgeon: Newt Minion, MD;  Location: [facility];  Service: Orthopedics;  Laterality: Left;    There were no vitals filed for this visit.   Subjective Assessment - 04/04/21 1139    Subjective Pt. indicated feeling similar overall while at home.  No specific pains upon arrival today.    Patient Stated Goals return to fishing and work    Currently in Pain? No/denies    Pain Score 0-No pain    Pain Onset 1 to 4 weeks ago                             [facility] Adult PT Treatment/Exercise - 04/04/21 0001      Neuro Re-ed    Neuro Re-ed Details  lateral stepping 3 cones x 8 bilateral, tandem ambulation on foam 8 ft x 6 fwd and reverse in bars c occasional hand assist      Knee/Hip Exercises: Stretches   Gastroc Stretch 30 seconds;3 reps;Left   incline board runner stretch     Knee/Hip Exercises: Aerobic   Recumbent Bike Partial revolutions 7 mins      Knee/Hip Exercises: Machines for Strengthening   Total Gym Leg Press 43 lbs 3 x 10 Lt Leg   hamstring stretch between  sets 30 sec c foot at top of platform     Knee/Hip Exercises: Standing   Terminal Knee Extension Left;15 reps   5 sec hold blue band     Knee/Hip Exercises: Seated   Sit to Sand without UE support;2 sets;10 reps   17.5 inch table     Manual Therapy   Manual therapy comments seated Lt knee flexion, distraction, IR mobilization c movement.  contract/relax techniques for Lt knee flexion                    PT Short Term Goals - 04/04/21 1209      PT SHORT TERM GOAL #1   Title I with HEP to include standing balance work    Time 3    Period Weeks    Status Achieved    Target Date 04/01/21      PT SHORT TERM GOAL #2   Title improve Lt knee flexion =/> 95 degrees to allow ease with stairs    Time 3    Period Weeks    Status Partially Met  Target Date 04/01/21      PT SHORT TERM GOAL #3   Title demo good Lt quad contraction to eliminate quad lag with SLR    Time 3    Period Weeks    Status Partially Met    Target Date 04/01/21      PT SHORT TERM GOAL #4   Title ambulate without assistive device on even surfaces with good form    Baseline 04/04/2021: able to perform independent in clinic under instruction    Time 3    Period Weeks    Status Partially Met    Target Date 04/01/21             PT Long Term Goals - 03/11/21 1003      PT LONG TERM GOAL #1   Title I with advanced HEP    Time 6    Period Weeks    Status New    Target Date 04/22/21      PT LONG TERM GOAL #2   Title improve Lt knee ROM to -2 extension and 115 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    Time 6    Period Weeks    Status New    Target Date 04/22/21      PT LONG TERM GOAL #3   Title demo Lt LE strength =/> 5/5 to allow return to normal acitivity    Time 6    Period Weeks    Status New    Target Date 04/22/21      PT LONG TERM GOAL #4   Title improve FOTO =/> 60    Time 6    Period Weeks    Status New    Target Date 04/22/21      PT LONG TERM GOAL #5   Title  demo good balance to tolerate fishing on a boat    Time 6    Period Weeks    Status New    Target Date 04/22/21                 Plan - 04/04/21 1207    Clinical Impression Statement Good overall performance on neuro re-ed intervention today, can benefit from progression in difficulty to improve challenge.    Personal Factors and Comorbidities Comorbidity 2;Profession    Comorbidities DM, Lt elbow fx,    Examination-Activity Limitations Bathing;Carry;Squat;Stairs;Stand    Examination-Participation Restrictions Driving;Occupation;Other    Stability/Clinical Decision Making Stable/Uncomplicated    Rehab Potential Excellent    PT Frequency 2x / week    PT Duration 6 weeks    PT Treatment/Interventions Cryotherapy;Electrical Stimulation;Moist Heat;Gait training;Stair training;Functional mobility training;Therapeutic exercise;Balance training;Neuromuscular re-education;Manual techniques;Dry needling;Passive range of motion;Patient/family education;Taping;Vasopneumatic Device    PT Next Visit Plan Progress note.  Continued to improve end range movements c manual intervention/ ther ex.  WB strengthening, quad eccentric control, dynamic and compliant surface balance.    PT Home Exercise Plan [email or web address]    Consulted and Agree with Plan of Care Patient           Patient will benefit from skilled therapeutic intervention in order to improve the following deficits and impairments:  Abnormal gait,Decreased range of motion,Difficulty walking,Obesity,Pain,Decreased balance,Decreased strength  Visit Diagnosis: Acute pain of left knee  Stiffness of left knee, not elsewhere classified  Muscle weakness (generalized)  Other abnormalities of gait and mobility  Localized edema     Problem List Patient Active Problem List   Diagnosis Date Noted  .  Arthritis of left knee 02/27/2021  . Unilateral primary osteoarthritis, left knee    Scot Jun, PT, DPT, OCS, ATC 04/04/21  12:11  PM    [facility] [address] [place], [place], [address] Phone: [phone]   Fax:  [phone]  Name: Travis White MRN: [id number] Date of Birth: [birth year]

## 2021-04-09 ENCOUNTER — Encounter: Payer: Self-pay | Admitting: Rehabilitative and Restorative Service Providers"

## 2021-04-09 ENCOUNTER — Ambulatory Visit (INDEPENDENT_AMBULATORY_CARE_PROVIDER_SITE_OTHER): Payer: BC Managed Care – PPO | Admitting: Rehabilitative and Restorative Service Providers"

## 2021-04-09 ENCOUNTER — Other Ambulatory Visit: Payer: Self-pay

## 2021-04-09 DIAGNOSIS — M25662 Stiffness of left knee, not elsewhere classified: Secondary | ICD-10-CM

## 2021-04-09 DIAGNOSIS — R600 Localized edema: Secondary | ICD-10-CM

## 2021-04-09 DIAGNOSIS — M25562 Pain in left knee: Secondary | ICD-10-CM | POA: Diagnosis not present

## 2021-04-09 DIAGNOSIS — R2689 Other abnormalities of gait and mobility: Secondary | ICD-10-CM | POA: Diagnosis not present

## 2021-04-09 DIAGNOSIS — M6281 Muscle weakness (generalized): Secondary | ICD-10-CM | POA: Diagnosis not present

## 2021-04-09 NOTE — Therapy (Addendum)
[facility] [address] [place], [place], [address] Phone: [phone]   Fax:  [phone]  Physical Therapy Treatment  Patient Details  Name: Travis White MRN: [id number] Date of Birth: Oct 03, 1961 Referring Provider (PT): Dr Meridee Score   Encounter Date: 04/09/2021   PT End of Session - 04/09/21 1140    Visit Number 9    Number of Visits 12    Date for PT Re-Evaluation 04/22/21    Authorization Type BCBS    PT Start Time 1137    PT Stop Time 1216    PT Time Calculation (min) 39 min    Activity Tolerance Patient tolerated treatment well    Behavior During Therapy [facility] for tasks assessed/performed           Past Medical History:  Diagnosis Date  . Arthritis   . Diabetes mellitus    type II    Past Surgical History:  Procedure Laterality Date  . Left elbow Left   . TOTAL KNEE ARTHROPLASTY Left 02/27/2021   Procedure: LEFT TOTAL KNEE ARTHROPLASTY;  Surgeon: Newt Minion, MD;  Location: [place];  Service: Orthopedics;  Laterality: Left;    There were no vitals filed for this visit.   Subjective Assessment - 04/09/21 1139    Subjective Pt. indicated hurting some saturday after rebuilding outdoor steps.  No pain today reported.    Patient Stated Goals return to fishing and work    Currently in Pain? No/denies    Pain Score 0-No pain    Pain Onset 1 to 4 weeks ago              [facility] PT Assessment - 04/09/21 0001      Assessment   Medical Diagnosis Lt TKA    Referring Provider (PT) Dr Meridee Score    Onset Date/Surgical Date 02/27/21    Hand Dominance Right    Next MD Visit 03/13/2021      AROM   Overall AROM Comments seated LAQ extension active: -18      Ambulation/Gait   Gait Comments Ambulation in clinic independently                         [facility] Adult PT Treatment/Exercise - 04/09/21 0001      Therapeutic Activites    Therapeutic Activities Other Therapeutic Activities    Other Therapeutic  Activities sit to stand 15 reps;without UE support; 18 inch chair.   ascending/descending flight of stairs x 2 each way with one hand on rail c cues for reciprocal gait (eccentric control still limited on Lt)      Neuro Re-ed    Neuro Re-ed Details  retro step on foam 20x Lt leg posterior, lateral stepping 3 cones x 8 each bilateral, tandem ambulation fwd/back 15 ft x 4 each way      Knee/Hip Exercises: Stretches   Gastroc Stretch 3 reps;30 seconds;Left   runner stretch on incline board     Knee/Hip Exercises: Aerobic   Recumbent Bike Partial revolutions 7 mins      Knee/Hip Exercises: Machines for Strengthening   Cybex Knee Extension eccentric Lt leg lowering 10 lbs 3 x 10    Cybex Knee Flexion Lt single leg 10 lbs 3 x 10      Knee/Hip Exercises: Standing   Other Standing Knee Exercises ascending/descending flight of stairs x 2 each way with one hand on rail c cues for reciprocal gait (eccentric control still limited  on Lt)      Knee/Hip Exercises: Seated   Other Seated Knee/Hip Exercises overpressure knee flexion 10 sec x 10 c Rt leg    Other Seated Knee/Hip Exercises seated SLR Lt 3 x 10    Sit to [employer] --                    PT Short Term Goals - 04/04/21 1209      PT SHORT TERM GOAL #1   Title I with HEP to include standing balance work    Time 3    Period Weeks    Status Achieved    Target Date 04/01/21      PT SHORT TERM GOAL #2   Title improve Lt knee flexion =/> 95 degrees to allow ease with stairs    Time 3    Period Weeks    Status Partially Met    Target Date 04/01/21      PT SHORT TERM GOAL #3   Title demo good Lt quad contraction to eliminate quad lag with SLR    Time 3    Period Weeks    Status Partially Met    Target Date 04/01/21      PT SHORT TERM GOAL #4   Title ambulate without assistive device on even surfaces with good form    Baseline 04/04/2021: able to perform independent in clinic under instruction    Time 3    Period Weeks     Status Partially Met    Target Date 04/01/21             PT Long Term Goals - 04/09/21 1208      PT LONG TERM GOAL #1   Title I with advanced HEP    Time 6    Period Weeks    Status On-going    Target Date 04/22/21      PT LONG TERM GOAL #2   Title improve Lt knee ROM to -2 extension and 115 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    Time 6    Period Weeks    Status On-going    Target Date 04/22/21      PT LONG TERM GOAL #3   Title demo Lt LE strength =/> 5/5 to allow return to normal acitivity    Time 6    Period Weeks    Status On-going    Target Date 04/22/21      PT LONG TERM GOAL #4   Title improve FOTO =/> 60    Time 6    Period Weeks    Status On-going    Target Date 04/22/21      PT LONG TERM GOAL #5   Title demo good balance to tolerate fishing on a boat    Time 6    Period Weeks    Status On-going    Target Date 04/22/21                 Plan - 04/09/21 1205    Clinical Impression Statement Slowly making gains in overall mobility at this time as well as slowly progressing in ambulation independence.  Plan to progress note data collect on next visit prior to MD visit.    Personal Factors and Comorbidities Comorbidity 2;Profession    Comorbidities DM, Lt elbow fx,    Examination-Activity Limitations Bathing;Carry;Squat;Stairs;Stand    Examination-Participation Restrictions Driving;Occupation;Other    Stability/Clinical Decision Making Stable/Uncomplicated    Rehab  Potential Excellent    PT Frequency 2x / week    PT Duration 6 weeks    PT Treatment/Interventions Cryotherapy;Electrical Stimulation;Moist Heat;Gait training;Stair training;Functional mobility training;Therapeutic exercise;Balance training;Neuromuscular re-education;Manual techniques;Dry needling;Passive range of motion;Patient/family education;Taping;Vasopneumatic Device , Therapeutic activity   PT Next Visit Plan Progress note for MD visit (corrected from last visit).    WB  strengthening, quad eccentric control, dynamic and compliant surface balance.    PT Home Exercise Plan [email or web address]    Consulted and Agree with Plan of Care Patient           Patient will benefit from skilled therapeutic intervention in order to improve the following deficits and impairments:  Abnormal gait,Decreased range of motion,Difficulty walking,Obesity,Pain,Decreased balance,Decreased strength  Visit Diagnosis: Acute pain of left knee  Stiffness of left knee, not elsewhere classified  Muscle weakness (generalized)  Other abnormalities of gait and mobility  Localized edema     Problem List Patient Active Problem List   Diagnosis Date Noted  . Arthritis of left knee 02/27/2021  . Unilateral primary osteoarthritis, left knee    Scot Jun, PT, DPT, OCS, ATC 04/09/21  12:14 PM    [facility] [address] [place], [place], [address] Phone: [phone]   Fax:  [phone]  Name: GENTRY PILSON MRN: [id number] Date of Birth: [birth year]

## 2021-04-11 ENCOUNTER — Ambulatory Visit (INDEPENDENT_AMBULATORY_CARE_PROVIDER_SITE_OTHER): Payer: BC Managed Care – PPO | Admitting: Rehabilitative and Restorative Service Providers"

## 2021-04-11 ENCOUNTER — Encounter: Payer: Self-pay | Admitting: Rehabilitative and Restorative Service Providers"

## 2021-04-11 ENCOUNTER — Other Ambulatory Visit: Payer: Self-pay

## 2021-04-11 DIAGNOSIS — R2689 Other abnormalities of gait and mobility: Secondary | ICD-10-CM | POA: Diagnosis not present

## 2021-04-11 DIAGNOSIS — R600 Localized edema: Secondary | ICD-10-CM

## 2021-04-11 DIAGNOSIS — M25562 Pain in left knee: Secondary | ICD-10-CM | POA: Diagnosis not present

## 2021-04-11 DIAGNOSIS — M6281 Muscle weakness (generalized): Secondary | ICD-10-CM

## 2021-04-11 DIAGNOSIS — M25662 Stiffness of left knee, not elsewhere classified: Secondary | ICD-10-CM | POA: Diagnosis not present

## 2021-04-11 NOTE — Therapy (Signed)
[facility] [address] [place], [place], [address] Phone: [phone]   Fax:  [phone]  Physical Therapy Treatment/Recertification Progress Note  Patient Details  Name: Travis White MRN: [id number] Date of Birth: [birth year] Referring Provider (PT): Dr Meridee Score   Encounter Date: 04/11/2021   Progress Note Reporting Period 03/11/2021 to 04/11/2021  See note below for Objective Data and Assessment of Progress/Goals.        PT End of Session - 04/11/21 1143    Visit Number 10    Number of Visits 18    Date for PT Re-Evaluation 05/09/21    Authorization Type BCBS    Progress Note Due on Visit 18    PT Start Time 1137    PT Stop Time 1217    PT Time Calculation (min) 40 min    Activity Tolerance Patient tolerated treatment well    Behavior During Therapy WFL for tasks assessed/performed           Past Medical History:  Diagnosis Date  . Arthritis   . Diabetes mellitus    type II    Past Surgical History:  Procedure Laterality Date  . Left elbow Left   . TOTAL KNEE ARTHROPLASTY Left 02/27/2021   Procedure: LEFT TOTAL KNEE ARTHROPLASTY;  Surgeon: Newt Minion, MD;  Location: [place];  Service: Orthopedics;  Laterality: Left;    There were no vitals filed for this visit.   Subjective Assessment - 04/11/21 1146    Subjective Pt. stated pain at worst in last week around 4/10.  No pain today.  Did use steps to enter clinic today.  Complaints noted in anterior inferior knee to proximal shin. Global Rating of Change from evaluation to now : +7    Patient Stated Goals return to fishing and work    Currently in Pain? No/denies    Pain Score 0-No pain    Pain Onset 1 to 4 weeks ago              [facility] PT Assessment - 04/11/21 0001      Assessment   Medical Diagnosis Lt TKA    Referring Provider (PT) Dr Meridee Score    Onset Date/Surgical Date 02/27/21    Hand Dominance Right      Observation/Other Assessments   Focus on  Therapeutic Outcomes (FOTO)  update 56%      Functional Tests   Functional tests Single leg stance      Single Leg Stance   Comments Lt SLS  10 seconds, Rt SLS: 15 seconds      AROM   Overall AROM Comments seated LAQ extension active -17 Lt    Left Knee Extension -5   in supine quad set   Left Knee Flexion 110   in supine heel slide     PROM   Left Knee Extension -6   in seated c overpressure     Strength   Right/Left Knee Right;Left    Right Knee Flexion 5/5    Right Knee Extension 5/5   48.6, 45.5 lbs   Left Knee Flexion 5/5    Left Knee Extension 4/5   28.5, 27.5 lbs                        [facility] Adult PT Treatment/Exercise - 04/11/21 0001      Neuro Re-ed    Neuro Re-ed Details  SLS c occasional hand touch 20 sec x 5 on  Lt, retro step on foam x 20 Lt leg posterior      Knee/Hip Exercises: Stretches   Passive Hamstring Stretch 30 seconds;3 reps;Left   on leg press after reps     Knee/Hip Exercises: Aerobic   Recumbent Bike Partial revolutions 10 mins      Knee/Hip Exercises: Machines for Strengthening   Total Gym Leg Press 62 lbs Lt leg 2 x 15      Knee/Hip Exercises: Seated   Sit to Sand without UE support;2 sets;10 reps   10 lbs, 17 inch table                   PT Short Term Goals - 04/11/21 1151      PT SHORT TERM GOAL #1   Title I with HEP to include standing balance work    Time 3    Period Weeks    Status Achieved    Target Date 04/01/21      PT SHORT TERM GOAL #2   Title improve Lt knee flexion =/> 95 degrees to allow ease with stairs    Time 3    Period Weeks    Status Partially Met    Target Date 04/01/21      PT SHORT TERM GOAL #3   Title demo good Lt quad contraction to eliminate quad lag with SLR    Time 3    Period Weeks    Status Partially Met    Target Date 04/01/21      PT SHORT TERM GOAL #4   Title ambulate without assistive device on even surfaces with good form    Baseline 04/04/2021: able to perform  independent in clinic under instruction    Time 3    Period Weeks    Status Partially Met    Target Date 04/01/21             PT Long Term Goals - 04/11/21 1152      PT LONG TERM GOAL #1   Title I with advanced HEP    Time 4    Period Weeks    Status Revised    Target Date 05/09/21      PT LONG TERM GOAL #2   Title improve Lt knee ROM to -2 extension and 115 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 will demonstrate bilateral SLS > 15 seconds to facilitate stability on boat.    Time 4    Period Weeks    Status New    Target Date 05/09/21                 Plan - 04/11/21 1144    Clinical Impression Statement Pt. has attended 10 visits overall during course of treatment.  See objective data for updated information regarding current presentation.  Pt. has made  gains as noted but does continue to demonstrate end range mobility impairments c strength/movement coordination deficits that impair functional activity independence and full return to PLOF.  Continued skilled PT services may be warranted for continued improvement towards goals and PLOF.    Personal Factors and Comorbidities Comorbidity 2;Profession    Comorbidities DM, Lt elbow fx,    Examination-Activity Limitations Bathing;Carry;Squat;Stairs;Stand    Examination-Participation Restrictions Driving;Occupation;Other    Stability/Clinical Decision Making Stable/Uncomplicated    Rehab Potential Excellent    PT Frequency 2x / week    PT Duration 4 weeks    PT Treatment/Interventions Cryotherapy;Electrical Stimulation;Moist Heat;Gait  training;Stair training;Functional mobility training;Therapeutic exercise;Balance training;Neuromuscular re-education;Manual techniques;Dry needling;Passive range of motion;Patient/family education;Taping;Vasopneumatic Device;ADLs/Self Care Home Management;Iontophoresis 45m/ml Dexamethasone;Therapeutic activities;Spinal Manipulations;Joint Manipulations;DME Instruction    PT Next Visit Plan Continue manual/ther ex for mobility gains, quad strengthening in WB, NWB, compliant surface balance.    PT Home Exercise Plan [email or web address]    Consulted and Agree with Plan of Care Patient           Patient will benefit from skilled therapeutic intervention in order to improve the following deficits and impairments:  Abnormal gait,Decreased range of motion,Difficulty walking,Obesity,Pain,Decreased balance,Decreased strength  Visit Diagnosis: Acute pain of left knee  Stiffness of left knee, not elsewhere classified  Muscle weakness (generalized)  Other abnormalities of gait and mobility  Localized edema     Problem List Patient Active Problem List   Diagnosis Date Noted  . Arthritis of left knee 02/27/2021  . Unilateral primary osteoarthritis, left knee     MScot Jun PT, DPT, OCS, ATC 04/11/21  12:17 PM    [facility] [address] [address]Phone: [phone]  Fax:  [phone] Name: Travis HAFLEYMRN: [id number] of Birth: 505-03-1961

## 2021-04-16 ENCOUNTER — Ambulatory Visit (INDEPENDENT_AMBULATORY_CARE_PROVIDER_SITE_OTHER): Payer: BC Managed Care – PPO | Admitting: Physician Assistant

## 2021-04-16 ENCOUNTER — Encounter: Payer: Self-pay | Admitting: Physician Assistant

## 2021-04-16 ENCOUNTER — Encounter: Payer: Self-pay | Admitting: Rehabilitative and Restorative Service Providers"

## 2021-04-16 ENCOUNTER — Ambulatory Visit (INDEPENDENT_AMBULATORY_CARE_PROVIDER_SITE_OTHER): Payer: BC Managed Care – PPO

## 2021-04-16 ENCOUNTER — Other Ambulatory Visit: Payer: Self-pay

## 2021-04-16 ENCOUNTER — Ambulatory Visit (INDEPENDENT_AMBULATORY_CARE_PROVIDER_SITE_OTHER): Payer: BC Managed Care – PPO | Admitting: Rehabilitative and Restorative Service Providers"

## 2021-04-16 DIAGNOSIS — R600 Localized edema: Secondary | ICD-10-CM

## 2021-04-16 DIAGNOSIS — M25662 Stiffness of left knee, not elsewhere classified: Secondary | ICD-10-CM | POA: Diagnosis not present

## 2021-04-16 DIAGNOSIS — M25562 Pain in left knee: Secondary | ICD-10-CM

## 2021-04-16 DIAGNOSIS — M6281 Muscle weakness (generalized): Secondary | ICD-10-CM | POA: Diagnosis not present

## 2021-04-16 DIAGNOSIS — R2689 Other abnormalities of gait and mobility: Secondary | ICD-10-CM

## 2021-04-16 DIAGNOSIS — G8929 Other chronic pain: Secondary | ICD-10-CM

## 2021-04-16 MED ORDER — OXYCODONE HCL 5 MG PO TABS: 5.0000 mg | ORAL_TABLET | ORAL | 0 refills | Status: DC | PRN

## 2021-04-16 NOTE — Progress Notes (Signed)
Office Visit Note   Patient: Travis White           Date of Birth: [birth year]           MRN: [id number] Visit Date: 04/16/2021              Requested by: No referring provider defined for this encounter. PCP: Patient, No Pcp Per (Inactive)  No chief complaint on file.     HPI: Patient is a pleasant 60 year old gentleman who is 6 weeks status post left total knee arthroplasty.  He is progressing with physical therapy and doing much better.  He did have a fall onto his knee in the shower couple days ago which caused some increased pain  Assessment & Plan: Visit Diagnoses:  1. Chronic pain of left knee     Plan: Patient will continue with physical therapy I will call in a prescription of his pain medication follow-up in 4 weeks.  Plans on being released to work on July 1  Follow-Up Instructions: No follow-ups on file.   Ortho Exam  Patient is alert, oriented, no adenopathy, well-dressed, normal affect, normal respiratory effort. Left knee no effusion no erythema no redness no ascending cellulitis.  He is lacking about 2 degrees of full extension and flexes to 118 per my assessment here today.  Compartments are soft and compressible he has a negative Homans' sign swelling has decreased.  Incision is well-healed without any surrounding erythema he has a small eschar at the very distal end of the incision that is dry and does not have any drainage  Imaging: XR Knee 1-2 Views Left  Result Date: 04/16/2021 Views of his left knee demonstrate components in good position no acute osseous changes or osseous injuries are appreciated  No images are attached to the encounter.  Labs: Lab Results  Component Value Date   HGBA1C 7.4 (H) 02/27/2021   HGBA1C (H) 12/12/2010    8.7 (NOTE)                                                                       According to the ADA Clinical Practice Recommendations for 2011, when HbA1c is used as a screening test:   >=6.5%   Diagnostic of  Diabetes Mellitus           (if abnormal result  is confirmed)  5.7-6.4%   Increased risk of developing Diabetes Mellitus  References:Diagnosis and Classification of Diabetes Mellitus,Diabetes Care,2011,34(Suppl 1):S62-S69 and Standards of Medical Care in         Diabetes - 2011,Diabetes Care,2011,34  (Suppl 1):S11-S61.     No results found for: ALBUMIN, PREALBUMIN, CBC  No results found for: MG No results found for: VD25OH  No results found for: PREALBUMIN CBC EXTENDED Latest Ref Rng & Units 02/25/2021 12/13/2010 12/12/2010  WBC 4.0 - 10.5 K/uL 11.4(H) - -  RBC 4.22 - 5.81 MIL/uL 5.12 - -  HGB 13.0 - 17.0 g/dL 80.3 11.0(L) 12.2(L)  HCT 39.0 - 52.0 % 45.6 33.2(L) 35.5(L)  PLT 150 - 400 K/uL 249 - -     There is no height or weight on file to calculate BMI.  Orders:  Orders Placed This Encounter  Procedures  . XR Knee 1-2 Views  Left   No orders of the defined types were placed in this encounter.    Procedures: No procedures performed  Clinical Data: No additional findings.  ROS:  All other systems negative, except as noted in the HPI. Review of Systems  Objective: Vital Signs: There were no vitals taken for this visit.  Specialty Comments:  No specialty comments available.  PMFS History: Patient Active Problem List   Diagnosis Date Noted  . Arthritis of left knee 02/27/2021  . Unilateral primary osteoarthritis, left knee    Past Medical History:  Diagnosis Date  . Arthritis   . Diabetes mellitus    type II    No family history on file.  Past Surgical History:  Procedure Laterality Date  . Left elbow Left   . TOTAL KNEE ARTHROPLASTY Left 02/27/2021   Procedure: LEFT TOTAL KNEE ARTHROPLASTY;  Surgeon: Nadara Mustard, MD;  Location: [facility] OR;  Service: Orthopedics;  Laterality: Left;   Social History   Occupational History  . Not on file  Tobacco Use  . Smoking status: Current Every Day Smoker    Packs/day: 1.00    Types: Cigarettes  . Smokeless tobacco:  Never Used  Vaping Use  . Vaping Use: Never used  Substance and Sexual Activity  . Alcohol use: No  . Drug use: No  . Sexual activity: Not on file

## 2021-04-16 NOTE — Therapy (Signed)
[facility] [address] [place], [place], [address] Phone: [phone]   Fax:  [phone]  Physical Therapy Treatment  Patient Details  Name: Travis White MRN: [id number] Date of Birth: April 17, 1961 Referring Provider (PT): Dr Meridee Score   Encounter Date: 04/16/2021   PT End of Session - 04/16/21 1133    Visit Number 11    Number of Visits 18    Date for PT Re-Evaluation 05/09/21    Authorization Type BCBS    Progress Note Due on Visit 18    PT Start Time 1129    PT Stop Time 1207    PT Time Calculation (min) 38 min    Activity Tolerance Patient tolerated treatment well    Behavior During Therapy [facility] for tasks assessed/performed           Past Medical History:  Diagnosis Date  . Arthritis   . Diabetes mellitus    type II    Past Surgical History:  Procedure Laterality Date  . Left elbow Left   . TOTAL KNEE ARTHROPLASTY Left 02/27/2021   Procedure: LEFT TOTAL KNEE ARTHROPLASTY;  Surgeon: Newt Minion, MD;  Location: [place];  Service: Orthopedics;  Laterality: Left;    There were no vitals filed for this visit.   Subjective Assessment - 04/16/21 1134    Subjective Pt. indicated a fall when getting out of the shower that resulted in landing on Lt knee.  Indicated sore and pain increase initially but some improvement now.  Rated 4/10 today.  Had xrays - negative.    Patient Stated Goals return to fishing and work    Pain Score 4     Pain Location Knee    Pain Orientation Left;Anterior    Pain Descriptors / Indicators Sore;Aching    Pain Type Surgical pain    Pain Onset 1 to 4 weeks ago    Pain Frequency Constant    Aggravating Factors  after fall feelings    Pain Relieving Factors icing, rest                             [facility] Adult PT Treatment/Exercise - 04/16/21 0001      Neuro Re-ed    Neuro Re-ed Details  lateral stepping 3 cones x 8 bilateral, single leg stance c anterior, anterior/lateral,  anterior/medial cone touches x 6 eahc bilateral      Knee/Hip Exercises: Stretches   Passive Hamstring Stretch 30 seconds;3 reps;Left    Gastroc Stretch 5 reps;30 seconds;Left   runner stretch incline board     Knee/Hip Exercises: Aerobic   Recumbent Bike Partial revolutions to full revolutions 10 mins total      Knee/Hip Exercises: Machines for Strengthening   Total Gym Leg Press 62 lbs Lt leg 2 x 15      Knee/Hip Exercises: Standing   Other Standing Knee Exercises step up and over eccentric lowering on Lt LE 4 inch step x 15      Knee/Hip Exercises: Seated   Other Seated Knee/Hip Exercises seated SLR 2 x 15 Lt                    PT Short Term Goals - 04/11/21 1151      PT SHORT TERM GOAL #1   Title I with HEP to include standing balance work    Time 3    Period Weeks    Status Achieved  Target Date 04/01/21      PT SHORT TERM GOAL #2   Title improve Lt knee flexion =/> 95 degrees to allow ease with stairs    Time 3    Period Weeks    Status Partially Met    Target Date 04/01/21      PT SHORT TERM GOAL #3   Title demo good Lt quad contraction to eliminate quad lag with SLR    Time 3    Period Weeks    Status Partially Met    Target Date 04/01/21      PT SHORT TERM GOAL #4   Title ambulate without assistive device on even surfaces with good form    Baseline 04/04/2021: able to perform independent in clinic under instruction    Time 3    Period Weeks    Status Partially Met    Target Date 04/01/21             PT Long Term Goals - 04/11/21 1152      PT LONG TERM GOAL #1   Title I with advanced HEP    Time 4    Period Weeks    Status Revised    Target Date 05/09/21      PT LONG TERM GOAL #2   Title improve Lt knee ROM to -2 extension and 115 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 will demonstrate bilateral SLS > 15 seconds to facilitate stability on boat.    Time 4    Period Weeks    Status New    Target Date 05/09/21                 Plan - 04/16/21 1200    Clinical Impression Statement No ill efffects noted today post fall with only mild symptom increase overall compared to previous visits.  Continued incorporation of balance intervention c progression of strengthening for improved eccentric control in progressive mobility for Lt leg with good tolerance overall.    Personal Factors and Comorbidities Comorbidity 2;Profession    Comorbidities DM, Lt elbow fx,    Examination-Activity Limitations Bathing;Carry;Squat;Stairs;Stand    Examination-Participation Restrictions Driving;Occupation;Other    Stability/Clinical Decision Making Stable/Uncomplicated    Rehab Potential Excellent    PT Frequency 2x / week    PT Duration 4 weeks    PT Treatment/Interventions Cryotherapy;Electrical Stimulation;Moist Heat;Gait training;Stair training;Functional mobility training;Therapeutic exercise;Balance training;Neuromuscular re-education;Manual techniques;Dry needling;Passive range of motion;Patient/family education;Taping;Vasopneumatic Device;ADLs/Self Care Home Management;Iontophoresis 48m/ml Dexamethasone;Therapeutic activities;Spinal Manipulations;Joint Manipulations;DME Instruction    PT Next Visit Plan Dynamic and compliant surface balance, quad strengthening on stair navigation, machine weights.    PT [place] Agree with Plan of Care Patient           Patient will benefit from skilled therapeutic intervention  in order to improve the following deficits and impairments:  Abnormal gait,Decreased range of  motion,Difficulty walking,Obesity,Pain,Decreased balance,Decreased strength  Visit Diagnosis: Acute pain of left knee  Stiffness of left knee, not elsewhere classified  Muscle weakness (generalized)  Other abnormalities of gait and mobility  Localized edema     Problem List Patient Active Problem List   Diagnosis Date Noted  . Arthritis of left knee 02/27/2021  . Unilateral primary osteoarthritis, left knee     Scot Jun, PT, DPT, OCS, ATC 04/16/21  12:06 PM    [facility] [address] [place], [place], [address] Phone: [phone]   Fax:  [phone]  Name: Travis White MRN: [id number] Date of Birth: 04/02/1961

## 2021-04-18 ENCOUNTER — Ambulatory Visit (INDEPENDENT_AMBULATORY_CARE_PROVIDER_SITE_OTHER): Payer: BC Managed Care – PPO | Admitting: Rehabilitative and Restorative Service Providers"

## 2021-04-18 ENCOUNTER — Encounter: Payer: Self-pay | Admitting: Rehabilitative and Restorative Service Providers"

## 2021-04-18 ENCOUNTER — Telehealth: Payer: Self-pay | Admitting: Physician Assistant

## 2021-04-18 ENCOUNTER — Other Ambulatory Visit: Payer: Self-pay

## 2021-04-18 DIAGNOSIS — M25662 Stiffness of left knee, not elsewhere classified: Secondary | ICD-10-CM | POA: Diagnosis not present

## 2021-04-18 DIAGNOSIS — R2689 Other abnormalities of gait and mobility: Secondary | ICD-10-CM | POA: Diagnosis not present

## 2021-04-18 DIAGNOSIS — M6281 Muscle weakness (generalized): Secondary | ICD-10-CM | POA: Diagnosis not present

## 2021-04-18 DIAGNOSIS — M25562 Pain in left knee: Secondary | ICD-10-CM

## 2021-04-18 DIAGNOSIS — R600 Localized edema: Secondary | ICD-10-CM

## 2021-04-18 NOTE — Telephone Encounter (Signed)
04/16/21 ov note faxed [facility] [phone]

## 2021-04-18 NOTE — Therapy (Addendum)
[facility] [address] [place], [place], [address] Phone: [phone]   Fax:  [phone]  Physical Therapy Treatment  Patient Details  Name: Travis White MRN: [id number] Date of Birth: [birth year] Referring Provider (PT): Dr Meridee Score   Encounter Date: 04/18/2021   PT End of Session - 04/18/21 1141     Visit Number 12    Number of Visits 18    Date for PT Re-Evaluation 05/09/21    Authorization Type BCBS    Progress Note Due on Visit 18    PT Start Time 1138    PT Stop Time 1217    PT Time Calculation (min) 39 min    Activity Tolerance Patient tolerated treatment well    Behavior During Therapy [facility] for tasks assessed/performed             Past Medical History:  Diagnosis Date   Arthritis    Diabetes mellitus    type II    Past Surgical History:  Procedure Laterality Date   Left elbow Left    TOTAL KNEE ARTHROPLASTY Left 02/27/2021   Procedure: LEFT TOTAL KNEE ARTHROPLASTY;  Surgeon: Newt Minion, MD;  Location: [place];  Service: Orthopedics;  Laterality: Left;    There were no vitals filed for this visit.     04/25/21 0752  Symptoms/Limitations  Subjective Pt. indicated knee was doing better since last visit.  Overall stiff with inactivity.  Patient Stated Goals return to fishing and work  Pain Assessment  Currently in Pain? No/denies (stiffness, not pain)  Pain Orientation Left  Pain Descriptors / Indicators Tightness  Pain Type Surgical pain  Pain Onset 1 to 4 weeks ago  Pain Frequency Intermittent  Aggravating Factors  prolonged sitting  Pain Relieving Factors getting some movement.                     [facility] Adult PT Treatment/Exercise - 04/18/21 0001       Neuro Re-ed    Neuro Re-ed Details  tandem ambulation fwd/back 8 t x 6 each way in bars, fitter board fwd/back 20x in bars, SLS cone touching anterior, anterior/medial, anterior/lateral x 10 bilateral      Knee/Hip Exercises:  Aerobic   Recumbent Bike Partial revolutions 4 mins, full revolutions 6 mins Lvl 1      Knee/Hip Exercises: Machines for Strengthening   Cybex Knee Extension eccentric Lt leg lowering 10 lbs 3 x 10      Knee/Hip Exercises: Standing   Forward Step Up Step Height: 6";20 reps;Both                      PT Short Term Goals - 04/11/21 1151       PT SHORT TERM GOAL #1   Title I with HEP to include standing balance work    Time 3    Period Weeks    Status Achieved    Target Date 04/01/21      PT SHORT TERM GOAL #2   Title improve Lt knee flexion =/> 95 degrees to allow ease with stairs    Time 3    Period Weeks    Status Partially Met    Target Date 04/01/21      PT SHORT TERM GOAL #3   Title demo good Lt quad contraction to eliminate quad lag with SLR    Time 3    Period Weeks    Status Partially Met  Target Date 04/01/21      PT SHORT TERM GOAL #4   Title ambulate without assistive device on even surfaces with good form    Baseline 04/04/2021: able to perform independent in clinic under instruction    Time 3    Period Weeks    Status Partially Met    Target Date 04/01/21               PT Long Term Goals - 04/11/21 1152       PT LONG TERM GOAL #1   Title I with advanced HEP    Time 4    Period Weeks    Status Revised    Target Date 05/09/21      PT LONG TERM GOAL #2   Title improve Lt knee ROM to -2 extension and 115 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 will demonstrate bilateral SLS > 15 seconds to facilitate stability on boat.    Time 4    Period Weeks     Status New    Target Date 05/09/21                 04/25/21 0753  Plan  Clinical Impression Statement Continued ambulation in clinic independently c mild deviation in gait due to limited stance on involved side.  Making improvements in ability to perform full extension in sitting LAQ but still lacking compared to normal 0 degrees. Continued skilled PT services warranted.  Personal Factors and Comorbidities Comorbidity 2;Profession  Comorbidities DM, Lt elbow fx,  Examination-Activity Limitations Bathing;Carry;Squat;Stairs;Stand  Examination-Participation Restrictions Driving;Occupation;Other  Pt will benefit from skilled therapeutic intervention in order to improve on the following deficits Abnormal gait;Decreased range of motion;Difficulty walking;Obesity;Pain;Decreased balance;Decreased strength  Stability/Clinical Decision Making Stable/Uncomplicated  Rehab Potential Excellent  PT Frequency 2x / week  PT Duration 4 weeks  PT Treatment/Interventions Cryotherapy;Electrical Stimulation;Moist Heat;Gait training;Stair training;Functional mobility training;Therapeutic exercise;Balance training;Neuromuscular re-education;Manual techniques;Dry needling;Passive range of motion;Patient/family education;Taping;Vasopneumatic Device;ADLs/Self Care Home Management;Iontophoresis 3m/ml Dexamethasone;Therapeutic activities;Spinal Manipulations;Joint Manipulations;DME Instruction  PT Next Visit Plan Continue with progression in Dynamic and compliant surface balance, quad strengthening on stair navigation, machine weights.  PT [place] Agree with Plan of Care Patient       Patient will benefit from skilled therapeutic intervention in order to improve the following deficits and impairments:     Visit Diagnosis: Acute pain of left knee  Stiffness of left knee, not elsewhere classified  Muscle weakness (generalized)  Other abnormalities of gait and  mobility  Localized edema     Problem List Patient Active Problem List   Diagnosis Date Noted   Arthritis of left knee 02/27/2021   Unilateral primary osteoarthritis, left knee    MScot Jun PT, DPT, OCS, ATC 04/18/21  12:14 PM   Addendum: MScot Jun PT,  DPT, OCS, ATC 04/25/21  7:55 AM   [facility] [address] [place], [place], [address] Phone: [phone]   Fax:  [phone]  Name: Travis White MRN: [id number] Date of Birth: 1961-11-07

## 2021-04-25 ENCOUNTER — Encounter: Payer: Self-pay | Admitting: Physical Therapy

## 2021-04-25 ENCOUNTER — Other Ambulatory Visit: Payer: Self-pay

## 2021-04-25 ENCOUNTER — Ambulatory Visit (INDEPENDENT_AMBULATORY_CARE_PROVIDER_SITE_OTHER): Payer: BC Managed Care – PPO | Admitting: Physical Therapy

## 2021-04-25 DIAGNOSIS — M25662 Stiffness of left knee, not elsewhere classified: Secondary | ICD-10-CM | POA: Diagnosis not present

## 2021-04-25 DIAGNOSIS — R2689 Other abnormalities of gait and mobility: Secondary | ICD-10-CM | POA: Diagnosis not present

## 2021-04-25 DIAGNOSIS — R600 Localized edema: Secondary | ICD-10-CM

## 2021-04-25 DIAGNOSIS — M6281 Muscle weakness (generalized): Secondary | ICD-10-CM

## 2021-04-25 DIAGNOSIS — M25562 Pain in left knee: Secondary | ICD-10-CM | POA: Diagnosis not present

## 2021-04-25 NOTE — Therapy (Addendum)
[facility] [address] [place], [place], [address] Phone: [phone]   Fax:  [phone]  Physical Therapy Treatment  Patient Details  Name: Travis White MRN: [id number] Date of Birth: 29-Oct-1961 Referring Provider (PT): Dr Meridee Score   Encounter Date: 04/25/2021   PT End of Session - 04/25/21 0838     Visit Number 13    Number of Visits 18    Date for PT Re-Evaluation 05/09/21    Authorization Type BCBS    Progress Note Due on Visit 18    PT Start Time 0758    PT Stop Time 0838    PT Time Calculation (min) 40 min    Activity Tolerance Patient tolerated treatment well    Behavior During Therapy [facility] for tasks assessed/performed             Past Medical History:  Diagnosis Date   Arthritis    Diabetes mellitus    type II    Past Surgical History:  Procedure Laterality Date   Left elbow Left    TOTAL KNEE ARTHROPLASTY Left 02/27/2021   Procedure: LEFT TOTAL KNEE ARTHROPLASTY;  Surgeon: Newt Minion, MD;  Location: [place];  Service: Orthopedics;  Laterality: Left;    There were no vitals filed for this visit.   Subjective Assessment - 04/25/21 0801     Subjective still having some mild pain on ant aspect of Lt knee, otherwise no complaints.  not using cane much    Patient Stated Goals return to fishing and work    Currently in Pain? Yes    Pain Score 4     Pain Location Knee    Pain Orientation Left    Pain Descriptors / Indicators Tightness    Pain Type Surgical pain;Acute pain    Pain Onset 1 to 4 weeks ago    Pain Frequency Intermittent    Aggravating Factors  prolonged sitting    Pain Relieving Factors increased movement                               [facility] Adult PT Treatment/Exercise - 04/25/21 0758       Knee/Hip Exercises: Stretches   Passive Hamstring Stretch 30 seconds;3 reps;Left      Knee/Hip Exercises: Aerobic   Recumbent Bike L2 x 10 min; seat 2      Knee/Hip Exercises:  Machines for Strengthening   Cybex Knee Extension eccentric Lt leg lowering 10 lbs 3 x 10      Knee/Hip Exercises: Standing   Heel Raises Both;3 sets;10 reps    Lateral Step Up Both;2 sets;10 reps;Hand Hold: 0;Step Height: 6"    Forward Step Up Both;Hand Hold: 0;Step Height: 6";2 sets;10 reps      Knee/Hip Exercises: Seated   Other Seated Knee/Hip Exercises seated SLR 3x10 with 3#    Sit to Sand without UE support;2 sets;10 reps                 Balance Exercises - 04/25/21 0824       Balance Exercises: Standing   Tandem Stance Eyes open;Intermittent upper extremity support   with head turns x 10 (horizontal/vertical)   Tandem Gait Forward;Retro;Intermittent upper extremity support;4 reps   in // bars                PT Short Term Goals - 04/11/21 1151       PT SHORT TERM GOAL #  1   Title I with HEP to include standing balance work    Time 3    Period Weeks    Status Achieved    Target Date 04/01/21      PT SHORT TERM GOAL #2   Title improve Lt knee flexion =/> 95 degrees to allow ease with stairs    Time 3    Period Weeks    Status Partially Met    Target Date 04/01/21      PT SHORT TERM GOAL #3   Title demo good Lt quad contraction to eliminate quad lag with SLR    Time 3    Period Weeks    Status Partially Met    Target Date 04/01/21      PT SHORT TERM GOAL #4   Title ambulate without assistive device on even surfaces with good form    Baseline 04/04/2021: able to perform independent in clinic under instruction    Time 3    Period Weeks    Status Partially Met    Target Date 04/01/21               PT Long Term Goals - 04/11/21 1152       PT LONG TERM GOAL #1   Title I with advanced HEP    Time 4    Period Weeks    Status Revised    Target Date 05/09/21      PT LONG TERM GOAL #2   Title improve Lt knee ROM to -2 extension and 115 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 will demonstrate bilateral SLS > 15 seconds to facilitate stability on boat.    Time 4    Period Weeks    Status New    Target Date 05/09/21                   Plan - 04/25/21 0838     Clinical Impression Statement Pt tolerated session well with expected fatigue noted at end of session.  Continued focus on strengthening and balance activities.  Will continue to benefit from PT to maximize function.    Personal Factors and Comorbidities Comorbidity 2;Profession    Comorbidities DM, Lt elbow fx,    Examination-Activity Limitations Bathing;Carry;Squat;Stairs;Stand    Examination-Participation Restrictions Driving;Occupation;Other    Stability/Clinical Decision Making Stable/Uncomplicated    Rehab Potential Excellent    PT Frequency 2x / week    PT Duration 4 weeks    PT Treatment/Interventions Cryotherapy;Electrical Stimulation;Moist Heat;Gait training;Stair training;Functional mobility training;Therapeutic exercise;Balance training;Neuromuscular re-education;Manual techniques;Dry needling;Passive range of motion;Patient/family education;Taping;Vasopneumatic Device;ADLs/Self Care Home Management;Iontophoresis 42m/ml Dexamethasone;Therapeutic activities;Spinal Manipulations;Joint Manipulations;DME Instruction    PT Next Visit Plan Continue with progression in Dynamic and compliant surface balance, quad strengthening on stair navigation, machine weights.    PT Home Exercise Plan  [id number]    Consulted and Agree with Plan of Care Patient             Patient will benefit from skilled therapeutic intervention in order to improve the following deficits  and impairments:  Abnormal gait, Decreased range of motion, Difficulty walking, Obesity, Pain, Decreased balance, Decreased strength  Visit Diagnosis: Acute pain of left knee  Stiffness of left knee, not elsewhere classified  Muscle weakness (generalized)  Other abnormalities of gait and mobility  Localized edema     Problem List Patient Active Problem List   Diagnosis Date Noted   Arthritis of left knee 02/27/2021   Unilateral primary osteoarthritis, left knee       Laureen Abrahams, PT, DPT 04/25/21 8:40 AM     [facility] [address] [place], [place], [address] Phone: [phone]   Fax:  [phone]  Name: Travis White MRN: [id number] Date of Birth: 1960/11/18

## 2021-05-02 ENCOUNTER — Ambulatory Visit (INDEPENDENT_AMBULATORY_CARE_PROVIDER_SITE_OTHER): Payer: BC Managed Care – PPO | Admitting: Rehabilitative and Restorative Service Providers"

## 2021-05-02 ENCOUNTER — Other Ambulatory Visit: Payer: Self-pay

## 2021-05-02 ENCOUNTER — Encounter: Payer: Self-pay | Admitting: Rehabilitative and Restorative Service Providers"

## 2021-05-02 DIAGNOSIS — R2689 Other abnormalities of gait and mobility: Secondary | ICD-10-CM

## 2021-05-02 DIAGNOSIS — M6281 Muscle weakness (generalized): Secondary | ICD-10-CM

## 2021-05-02 DIAGNOSIS — R600 Localized edema: Secondary | ICD-10-CM

## 2021-05-02 DIAGNOSIS — M25662 Stiffness of left knee, not elsewhere classified: Secondary | ICD-10-CM | POA: Diagnosis not present

## 2021-05-02 DIAGNOSIS — M25562 Pain in left knee: Secondary | ICD-10-CM | POA: Diagnosis not present

## 2021-05-02 NOTE — Therapy (Signed)
[facility] [address] [place], [place], [address] Phone: [phone]   Fax:  [phone]  Physical Therapy Treatment  Patient Details  Name: Travis White MRN: [id number] Date of Birth: Nov 22, 1960 Referring Provider (PT): Dr Meridee Score   Encounter Date: 05/02/2021   PT End of Session - 05/02/21 0848     Visit Number 14    Number of Visits 18    Date for PT Re-Evaluation 05/09/21    Authorization Type BCBS    Progress Note Due on Visit 18    PT Start Time 0845    PT Stop Time 0924    PT Time Calculation (min) 39 min    Activity Tolerance Patient tolerated treatment well    Behavior During Therapy [facility] for tasks assessed/performed             Past Medical History:  Diagnosis Date   Arthritis    Diabetes mellitus    type II    Past Surgical History:  Procedure Laterality Date   Left elbow Left    TOTAL KNEE ARTHROPLASTY Left 02/27/2021   Procedure: LEFT TOTAL KNEE ARTHROPLASTY;  Surgeon: Newt Minion, MD;  Location: [place];  Service: Orthopedics;  Laterality: Left;    There were no vitals filed for this visit.   Subjective Assessment - 05/02/21 0850     Subjective Pt. indicated feeling stiffness overall, walking without cane more.  No real pain to report today.    Patient Stated Goals return to fishing and work    Currently in Pain? No/denies    Pain Score 0-No pain    Pain Onset 1 to 4 weeks ago                [facility] PT Assessment - 05/02/21 0001       Assessment   Medical Diagnosis Lt TKA    Referring Provider (PT) Dr Meridee Score    Onset Date/Surgical Date 02/27/21    Hand Dominance Right                           [facility] Adult PT Treatment/Exercise - 05/02/21 0001       Neuro Re-ed    Neuro Re-ed Details  tandem ambulation on foam fwd/back 10 ft x 6 each way in bars, fitter wobble board fwd/back 30 x each, single leg balance cone touches (anterior, anterior/medial, anterior/lateral) x  6 each cone bilaterally      Knee/Hip Exercises: Stretches   Gastroc Stretch 30 seconds;3 reps;Left   runner stretch incline board     Knee/Hip Exercises: Aerobic   Recumbent Bike Lvl 2 10 mins seat 2      Knee/Hip Exercises: Standing   Lateral Step Up Step Height: 6";Hand Hold: 1;10 reps;2 sets;Both    Forward Step Up Step Height: 6";10 reps;2 sets;Both;Hand Hold: 0      Knee/Hip Exercises: Seated   Sit to Sand without UE support;3 sets;10 reps   10 lbs                     PT Short Term Goals - 04/11/21 1151       PT SHORT TERM GOAL #1   Title I with HEP to include standing balance work    Time 3    Period Weeks    Status Achieved    Target Date 04/01/21      PT SHORT TERM GOAL #2   Title  improve Lt knee flexion =/> 95 degrees to allow ease with stairs    Time 3    Period Weeks    Status Partially Met    Target Date 04/01/21      PT SHORT TERM GOAL #3   Title demo good Lt quad contraction to eliminate quad lag with SLR    Time 3    Period Weeks    Status Partially Met    Target Date 04/01/21      PT SHORT TERM GOAL #4   Title ambulate without assistive device on even surfaces with good form    Baseline 04/04/2021: able to perform independent in clinic under instruction    Time 3    Period Weeks    Status Partially Met    Target Date 04/01/21               PT Long Term Goals - 04/11/21 1152       PT LONG TERM GOAL #1   Title I with advanced HEP    Time 4    Period Weeks    Status Revised    Target Date 05/09/21      PT LONG TERM GOAL #2   Title improve Lt knee ROM to -2 extension and 115 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 will demonstrate bilateral SLS > 15 seconds to facilitate stability on boat.    Time 4    Period Weeks    Status New    Target Date 05/09/21                   Plan - 05/02/21 0910     Clinical Impression Statement Pt. making continued gains in balance and functional control on ascending/descending stairs which has helped progress from cane reliance and improved functional community mobility level.  Pt. to continue to benefit from skilled PT services for continued dynamic and compliant surface balance.    Personal Factors and Comorbidities Comorbidity 2;Profession    Comorbidities DM, Lt elbow fx,    Examination-Activity Limitations Bathing;Carry;Squat;Stairs;Stand    Examination-Participation Restrictions Driving;Occupation;Other    Stability/Clinical Decision Making Stable/Uncomplicated    Rehab Potential Excellent    PT Frequency 2x / week    PT Duration 4 weeks    PT Treatment/Interventions Cryotherapy;Electrical Stimulation;Moist Heat;Gait training;Stair training;Functional mobility training;Therapeutic exercise;Balance training;Neuromuscular re-education;Manual techniques;Dry needling;Passive range of motion;Patient/family education;Taping;Vasopneumatic Device;ADLs/Self Care Home Management;Iontophoresis 35m/ml Dexamethasone;Therapeutic activities;Spinal Manipulations;Joint Manipulations;DME Instruction    PT Next Visit Plan WB strengthening, compliant surface and dynamic balance.    PT HKremlinand Agree with Plan of Care Patient             Patient will benefit from skilled therapeutic intervention in order to improve the following deficits and impairments:  Abnormal gait,  Decreased range of motion, Difficulty walking, Obesity, Pain, Decreased balance, Decreased strength  Visit Diagnosis: Acute pain of left knee  Stiffness of left knee, not elsewhere  classified  Muscle weakness (generalized)  Other abnormalities of gait and mobility  Localized edema     Problem List Patient Active Problem List   Diagnosis Date Noted   Arthritis of left knee 02/27/2021   Unilateral primary osteoarthritis, left knee     Scot Jun, PT, DPT, OCS, ATC 05/02/21  9:20 AM    [facility] [address] [place], [place], [address] Phone: [phone]   Fax:  [phone]  Name: Travis White MRN: [id number] Date of Birth: 1961-10-12

## 2021-05-07 ENCOUNTER — Telehealth: Payer: Self-pay | Admitting: Rehabilitative and Restorative Service Providers"

## 2021-05-07 ENCOUNTER — Encounter: Payer: BC Managed Care – PPO | Admitting: Rehabilitative and Restorative Service Providers"

## 2021-05-07 NOTE — Telephone Encounter (Signed)
Called and spoke to patient after no show for today.  Pt. Indicated he knew about Thursdays but didn't realize there was an appointment scheduled for today.   Chyrel Masson, PT, DPT, OCS, ATC 05/07/21  9:04 AM

## 2021-05-09 ENCOUNTER — Other Ambulatory Visit: Payer: Self-pay

## 2021-05-09 ENCOUNTER — Encounter: Payer: Self-pay | Admitting: Rehabilitative and Restorative Service Providers"

## 2021-05-09 ENCOUNTER — Ambulatory Visit (INDEPENDENT_AMBULATORY_CARE_PROVIDER_SITE_OTHER): Payer: BC Managed Care – PPO | Admitting: Rehabilitative and Restorative Service Providers"

## 2021-05-09 ENCOUNTER — Telehealth: Payer: Self-pay | Admitting: Physician Assistant

## 2021-05-09 DIAGNOSIS — M6281 Muscle weakness (generalized): Secondary | ICD-10-CM | POA: Diagnosis not present

## 2021-05-09 DIAGNOSIS — M25562 Pain in left knee: Secondary | ICD-10-CM

## 2021-05-09 DIAGNOSIS — R2689 Other abnormalities of gait and mobility: Secondary | ICD-10-CM

## 2021-05-09 DIAGNOSIS — R600 Localized edema: Secondary | ICD-10-CM

## 2021-05-09 DIAGNOSIS — M25662 Stiffness of left knee, not elsewhere classified: Secondary | ICD-10-CM | POA: Diagnosis not present

## 2021-05-09 NOTE — Therapy (Signed)
[facility] [address] [place], [place], [address] Phone: [phone]   Fax:  [phone]  Physical Therapy Treatment /Discharge   Patient Details  Name: Travis White MRN: [id number] Date of Birth: Feb 29, 1961 Referring Provider (PT): Dr Meridee Score   Encounter Date: 05/09/2021  PHYSICAL THERAPY DISCHARGE SUMMARY  Visits from Start of Care: 15  Current functional level related to goals / functional outcomes: See note   Remaining deficits: See note   Education / Equipment: HEP   Patient agrees to discharge. Patient goals were partially met. Patient is being discharged due to being pleased with the current functional level.    PT End of Session - 05/09/21 1150     Visit Number 15    Number of Visits 18    Date for PT Re-Evaluation 05/09/21    Authorization Type BCBS    Progress Note Due on Visit 18    PT Start Time 1142    PT Stop Time 1206    PT Time Calculation (min) 24 min    Activity Tolerance Patient tolerated treatment well    Behavior During Therapy WFL for tasks assessed/performed             Past Medical History:  Diagnosis Date   Arthritis    Diabetes mellitus    type II    Past Surgical History:  Procedure Laterality Date   Left elbow Left    TOTAL KNEE ARTHROPLASTY Left 02/27/2021   Procedure: LEFT TOTAL KNEE ARTHROPLASTY;  Surgeon: Newt Minion, MD;  Location: [place];  Service: Orthopedics;  Laterality: Left;    There were no vitals filed for this visit.   Subjective Assessment - 05/09/21 1146     Subjective Pt. reported wanting to get back to work but that was planned for Tuesday.  Pt. indicated no pain.    Patient Stated Goals return to fishing and work    Currently in Pain? No/denies    Pain Onset 1 to 4 weeks ago                [facility] PT Assessment - 05/09/21 0001       Assessment   Medical Diagnosis Lt TKA    Referring Provider (PT) Dr Meridee Score    Onset Date/Surgical Date  02/27/21    Hand Dominance Right    Next MD Visit 03/13/2021      Observation/Other Assessments   Focus on Therapeutic Outcomes (FOTO)  update 69 %      AROM   Overall AROM Comments -9 in seated LAQ    Left Knee Extension -4   in quad set supine   Left Knee Flexion 113      Strength   Left Knee Flexion 5/5    Left Knee Extension 5/5   40, 40.2 lbs                          [facility] Adult PT Treatment/Exercise - 05/09/21 0001       Exercises   Exercises Other Exercises    Other Exercises  Review of existing HEP for knowledge and cues for continued use for continued gains.      Knee/Hip Exercises: Aerobic   Other Aerobic UBE LE only lvl 5 10 mins      Knee/Hip Exercises: Machines for Strengthening   Cybex Knee Extension eccentric Lt leg lowering 10 lbs 3 x 10  PT Education - 05/09/21 1206     Education Details HEP review for d/c    Person(s) Educated Patient    Methods Explanation;Verbal cues    Comprehension Verbalized understanding              PT Short Term Goals - 04/11/21 1151       PT SHORT TERM GOAL #1   Title I with HEP to include standing balance work    Time 3    Period Weeks    Status Achieved    Target Date 04/01/21      PT SHORT TERM GOAL #2   Title improve Lt knee flexion =/> 95 degrees to allow ease with stairs    Time 3    Period Weeks    Status Partially Met    Target Date 04/01/21      PT SHORT TERM GOAL #3   Title demo good Lt quad contraction to eliminate quad lag with SLR    Time 3    Period Weeks    Status Partially Met    Target Date 04/01/21      PT SHORT TERM GOAL #4   Title ambulate without assistive device on even surfaces with good form    Baseline 04/04/2021: able to perform independent in clinic under instruction    Time 3    Period Weeks    Status Partially Met    Target Date 04/01/21               PT Long Term Goals - 05/09/21 1206       PT LONG TERM GOAL #1    Title I with advanced HEP    Time 4    Period Weeks    Status Achieved      PT LONG TERM GOAL #2   Title improve Lt knee ROM to -2 extension and 115 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 will demonstrate bilateral SLS > 15 seconds to facilitate stability on boat.    Time 4    Period Weeks    Status Partially Met                   Plan - 05/09/21 1151     Clinical Impression Statement Pt. has attended 15 visits overall during course of treatment showing good progress as noted in objective data gathering.  See objective data charts for most recent information.  Pt. has transitioned to independent ambulation at this time c mild deviations in stance on involved side still noted.  Pt. has demonstrated minimal pain complaints and showed good knowledge of HEP at this time.  Recommend trial HEP d/c at this time.    Personal Factors and Comorbidities Comorbidity 2;Profession    Comorbidities DM, Lt elbow fx,    Examination-Activity Limitations Bathing;Carry;Squat;Stairs;Stand    Examination-Participation Restrictions Driving;Occupation;Other    Stability/Clinical Decision Making  Stable/Uncomplicated    Rehab Potential Excellent    PT Treatment/Interventions Cryotherapy;Electrical Stimulation;Moist Heat;Gait training;Stair training;Functional mobility training;Therapeutic exercise;Balance training;Neuromuscular re-education;Manual techniques;Dry needling;Passive range of motion;Patient/family education;Taping;Vasopneumatic Device;ADLs/Self Care Home Management;Iontophoresis 69m/ml Dexamethasone;Therapeutic activities;Spinal Manipulations;Joint  Manipulations;DME Instruction    PT Next Visit Plan Trial d/c to HEP.    PT Home Exercise Plan [id number]    [id number] Agree with Plan of Care Patient             Patient will benefit from skilled therapeutic intervention in order to improve the following deficits and impairments:  Abnormal gait, Decreased range of motion, Difficulty walking, Obesity, Pain, Decreased balance, Decreased strength  Visit Diagnosis: Acute pain of left knee  Stiffness of left knee, not elsewhere classified  Muscle weakness (generalized)  Other abnormalities of gait and mobility  Localized edema     Problem List Patient Active Problem List   Diagnosis Date Noted   Arthritis of left knee 02/27/2021   Unilateral primary osteoarthritis, left knee    MScot Jun PT, DPT, OCS, ATC 05/09/21  12:08 PM    [facility] [address] [address]Phone: [phone]  Fax:  [phone] Name: Travis INCLANMRN: [id number] of Birth: [birth year]

## 2021-05-09 NOTE — Telephone Encounter (Signed)
Pt called stating when he had his work leave extended from 04/24/21 to 05/10/21 a form was supposed to be filled out and faxed back to his employer. However pt is stating they never received anything back, pt was hoping we could try resending this form.

## 2021-05-09 NOTE — Telephone Encounter (Signed)
Thank you I will CB and let pt know.

## 2021-05-10 ENCOUNTER — Telehealth: Payer: Self-pay | Admitting: Physician Assistant

## 2021-05-10 NOTE — Telephone Encounter (Signed)
Received $25.00 cash from patient for disability/FMLA forms    Forwarding to [employer] today

## 2021-05-14 ENCOUNTER — Encounter: Payer: BC Managed Care – PPO | Admitting: Rehabilitative and Restorative Service Providers"

## 2021-05-14 ENCOUNTER — Encounter: Payer: Self-pay | Admitting: Physician Assistant

## 2021-05-14 ENCOUNTER — Other Ambulatory Visit: Payer: Self-pay

## 2021-05-14 ENCOUNTER — Ambulatory Visit (INDEPENDENT_AMBULATORY_CARE_PROVIDER_SITE_OTHER): Payer: BC Managed Care – PPO | Admitting: Physician Assistant

## 2021-05-14 DIAGNOSIS — Z720 Tobacco use: Secondary | ICD-10-CM | POA: Diagnosis not present

## 2021-05-14 DIAGNOSIS — G8929 Other chronic pain: Secondary | ICD-10-CM

## 2021-05-14 DIAGNOSIS — M25562 Pain in left knee: Secondary | ICD-10-CM

## 2021-05-14 DIAGNOSIS — E1151 Type 2 diabetes mellitus with diabetic peripheral angiopathy without gangrene: Secondary | ICD-10-CM | POA: Diagnosis not present

## 2021-05-14 DIAGNOSIS — I10 Essential (primary) hypertension: Secondary | ICD-10-CM | POA: Diagnosis not present

## 2021-05-14 NOTE — Progress Notes (Signed)
Office Visit Note   Patient: Travis White           Date of Birth: 02-Jul-1961           MRN: [id number] Visit Date: 05/14/2021              Requested by: No referring provider defined for this encounter. PCP: Patient, No Pcp Per (Inactive)  Chief Complaint  Patient presents with   Left Knee - Follow-up      HPI: Patient presents today for follow-up of his left total knee arthroplasty.  He is now 2 months since surgery.  He went back to work today.  He does wear a compression knee sleeve while he is at work because he feels it supportive  Assessment & Plan: Visit Diagnoses: No diagnosis found.  Plan: Patient will follow-up as needed for the left side.  He would like to go forward and schedule his right knee replacements which she has discussed with Dr. Lajoyce Corners.  Would like to have this done in October  Follow-Up Instructions: No follow-ups on file.   Ortho Exam  Patient is alert, oriented, no adenopathy, well-dressed, normal affect, normal respiratory effort. Left knee well-healed surgical incision no effusion mild soft tissue swelling but no ascending cellulitis or erythema has full extension and flexion to almost 120 degrees compartments are soft and nontender no signs of infection  Imaging: No results found. No images are attached to the encounter.  Labs: Lab Results  Component Value Date   HGBA1C 7.4 (H) 02/27/2021   HGBA1C (H) 12/12/2010    8.7 (NOTE)                                                                       According to the ADA Clinical Practice Recommendations for 2011, when HbA1c is used as a screening test:   >=6.5%   Diagnostic of Diabetes Mellitus           (if abnormal result  is confirmed)  5.7-6.4%   Increased risk of developing Diabetes Mellitus  References:Diagnosis and Classification of Diabetes Mellitus,Diabetes Care,2011,34(Suppl 1):S62-S69 and Standards of Medical Care in         Diabetes - 2011,Diabetes Care,2011,34  (Suppl 1):S11-S61.      No results found for: ALBUMIN, PREALBUMIN, CBC  No results found for: MG No results found for: VD25OH  No results found for: PREALBUMIN CBC EXTENDED Latest Ref Rng & Units 02/25/2021 12/13/2010 12/12/2010  WBC 4.0 - 10.5 K/uL 11.4(H) - -  RBC 4.22 - 5.81 MIL/uL 5.12 - -  HGB 13.0 - 17.0 g/dL 22.2 11.0(L) 12.2(L)  HCT 39.0 - 52.0 % 45.6 33.2(L) 35.5(L)  PLT 150 - 400 K/uL 249 - -     There is no height or weight on file to calculate BMI.  Orders:  No orders of the defined types were placed in this encounter.  No orders of the defined types were placed in this encounter.    Procedures: No procedures performed  Clinical Data: No additional findings.  ROS:  All other systems negative, except as noted in the HPI. Review of Systems  Objective: Vital Signs: There were no vitals taken for this visit.  Specialty Comments:  No specialty comments available.  PMFS History: Patient Active Problem List   Diagnosis Date Noted   Arthritis of left knee 02/27/2021   Unilateral primary osteoarthritis, left knee    Past Medical History:  Diagnosis Date   Arthritis    Diabetes mellitus    type II    No family history on file.  Past Surgical History:  Procedure Laterality Date   Left elbow Left    TOTAL KNEE ARTHROPLASTY Left 02/27/2021   Procedure: LEFT TOTAL KNEE ARTHROPLASTY;  Surgeon: Nadara Mustard, MD;  [facility];  Service: Orthopedics;  Laterality: Left;   Social History   Occupational History   Not on file  Tobacco Use   Smoking status: Every Day    Packs/day: 1.00    Pack years: 0.00    Types: Cigarettes   Smokeless tobacco: Never  Vaping Use   Vaping Use: Never used  Substance and Sexual Activity   Alcohol use: No   Drug use: No   Sexual activity: Not on file

## 2021-05-31 ENCOUNTER — Other Ambulatory Visit: Payer: Self-pay | Admitting: Internal Medicine

## 2021-05-31 ENCOUNTER — Ambulatory Visit (HOSPITAL_COMMUNITY)
Admission: RE | Admit: 2021-05-31 | Discharge: 2021-05-31 | Disposition: A | Discharge status: Home / Self Care | Payer: BC Managed Care – PPO | Source: Ambulatory Visit | Attending: Internal Medicine | Admitting: Internal Medicine

## 2021-05-31 ENCOUNTER — Other Ambulatory Visit: Payer: Self-pay

## 2021-05-31 DIAGNOSIS — R1011 Right upper quadrant pain: Secondary | ICD-10-CM

## 2021-05-31 DIAGNOSIS — R109 Unspecified abdominal pain: Secondary | ICD-10-CM | POA: Diagnosis not present

## 2021-05-31 DIAGNOSIS — K760 Fatty (change of) liver, not elsewhere classified: Secondary | ICD-10-CM | POA: Diagnosis not present

## 2021-05-31 DIAGNOSIS — E1151 Type 2 diabetes mellitus with diabetic peripheral angiopathy without gangrene: Secondary | ICD-10-CM | POA: Diagnosis not present

## 2021-05-31 DIAGNOSIS — R110 Nausea: Secondary | ICD-10-CM

## 2021-06-25 DIAGNOSIS — M109 Gout, unspecified: Secondary | ICD-10-CM | POA: Diagnosis not present

## 2021-06-25 DIAGNOSIS — E785 Hyperlipidemia, unspecified: Secondary | ICD-10-CM | POA: Diagnosis not present

## 2021-06-25 DIAGNOSIS — Z125 Encounter for screening for malignant neoplasm of prostate: Secondary | ICD-10-CM | POA: Diagnosis not present

## 2021-06-25 DIAGNOSIS — E1151 Type 2 diabetes mellitus with diabetic peripheral angiopathy without gangrene: Secondary | ICD-10-CM | POA: Diagnosis not present

## 2021-06-25 DIAGNOSIS — I10 Essential (primary) hypertension: Secondary | ICD-10-CM | POA: Diagnosis not present

## 2021-07-02 DIAGNOSIS — I10 Essential (primary) hypertension: Secondary | ICD-10-CM | POA: Diagnosis not present

## 2021-07-02 DIAGNOSIS — Z0000 Encounter for general adult medical examination without abnormal findings: Secondary | ICD-10-CM | POA: Diagnosis not present

## 2021-07-02 DIAGNOSIS — M25561 Pain in right knee: Secondary | ICD-10-CM | POA: Diagnosis not present

## 2021-07-02 DIAGNOSIS — R82998 Other abnormal findings in urine: Secondary | ICD-10-CM | POA: Diagnosis not present

## 2021-07-09 ENCOUNTER — Other Ambulatory Visit: Payer: Self-pay | Admitting: Internal Medicine

## 2021-07-09 DIAGNOSIS — Z720 Tobacco use: Secondary | ICD-10-CM

## 2021-08-21 ENCOUNTER — Telehealth: Payer: Self-pay | Admitting: Orthopedic Surgery

## 2021-08-21 NOTE — Telephone Encounter (Signed)
[employer] forms received, To [employer].

## 2021-08-22 ENCOUNTER — Telehealth: Payer: Self-pay | Admitting: Orthopedic Surgery

## 2021-08-22 NOTE — Telephone Encounter (Signed)
Pt submitted medical release form, Pt states [employer] faxed short term forms, and pt pais $25.00 cash payment to [employer]. Accepted 08/22/21

## 2021-08-29 DIAGNOSIS — Z23 Encounter for immunization: Secondary | ICD-10-CM | POA: Diagnosis not present

## 2021-08-30 ENCOUNTER — Other Ambulatory Visit: Payer: Self-pay

## 2021-08-30 ENCOUNTER — Other Ambulatory Visit: Payer: Self-pay | Admitting: Family

## 2021-09-02 NOTE — Pre-Procedure Instructions (Signed)
Surgical Instructions    Your procedure is scheduled on Friday 09/06/21.   Report to [facility] Main Entrance "A" at 05:30 A.M., then check in with the Admitting office.  Call this number if you have problems the morning of surgery:  [phone]   If you have any questions prior to your surgery date call [phone]: Open Monday-Friday 8am-4pm    Remember:  Do not eat after midnight the night before your surgery  You may drink clear liquids until 04:30 A.M. the morning of your surgery.   Clear liquids allowed are: Water, Non-Citrus Juices (without pulp), Carbonated Beverages, Clear Tea, Black Coffee ONLY (NO MILK, CREAM OR POWDERED CREAMER of any kind), and Gatorade   Patient Instructions  The night before surgery:  No food after midnight. ONLY clear liquids after midnight The day of surgery (if you have diabetes): Drink ONE (1) 12 oz G2 given to you in your pre admission testing appointment by 04:30 A.M. the morning of surgery. Drink in one sitting. Do not sip.  This drink was given to you during your hospital  pre-op appointment visit.  Nothing else to drink after completing the  12 oz bottle of G2.         If you have questions, please contact your surgeon's office.   Take these medicines the morning of surgery with A SIP OF WATER   acetaminophen (TYLENOL)  allopurinol (ZYLOPRIM)  rosuvastatin (CRESTOR)  As of today, STOP taking any Aspirin (unless otherwise instructed by your surgeon) Aleve, Naproxen, Ibuprofen, Motrin, Advil, Goody's, BC's, all herbal medications, fish oil, and all vitamins.  WHAT DO I DO ABOUT MY DIABETES MEDICATION?   Do not take oral diabetes medicines (pills) the morning of surgery.  DO NOT TAKE FARXIGA the day before surgery (09/05/21) and DO NOT TAKE FARXIGA the morning of surgery (09/06/21).  DO NOT TAKE metFORMIN (GLUCOPHAGE) the morning of surgery (09/06/21).  DO NOT TAKE TRULICITY the morning of surgery (09/06/21).   THE MORNING OF  SURGERY, take 32 units of TRESIBA FLEXTOUCH insulin.   DO NOT TAKE HUMALOG KWIKPEN the morning of surgery.   The day of surgery, do not take other diabetes injectables, including Byetta (exenatide), Bydureon (exenatide ER), Victoza (liraglutide), or Trulicity (dulaglutide).  If your CBG is greater than 220 mg/dL, you may take  of your sliding scale (correction) dose of insulin.   HOW TO MANAGE YOUR DIABETES BEFORE AND AFTER SURGERY  Why is it important to control my blood sugar before and after surgery? Improving blood sugar levels before and after surgery helps healing and can limit problems. A way of improving blood sugar control is eating a healthy diet by:  Eating less sugar and carbohydrates  Increasing activity/exercise  Talking with your doctor about reaching your blood sugar goals High blood sugars (greater than 180 mg/dL) can raise your risk of infections and slow your recovery, so you will need to focus on controlling your diabetes during the weeks before surgery. Make sure that the doctor who takes care of your diabetes knows about your planned surgery including the date and location.  How do I manage my blood sugar before surgery? Check your blood sugar at least 4 times a day, starting 2 days before surgery, to make sure that the level is not too high or low.  Check your blood sugar the morning of your surgery when you wake up and every 2 hours until you get to the Short Stay unit.  If your blood sugar is  less than 70 mg/dL, you will need to treat for low blood sugar: Do not take insulin. Treat a low blood sugar (less than 70 mg/dL) with  cup of clear juice (cranberry or apple), 4 glucose tablets, OR glucose gel. Recheck blood sugar in 15 minutes after treatment (to make sure it is greater than 70 mg/dL). If your blood sugar is not greater than 70 mg/dL on recheck, call [phone] for further instructions. Report your blood sugar to the short stay nurse when you get to  Short Stay.  If you are admitted to the hospital after surgery: Your blood sugar will be checked by the staff and you will probably be given insulin after surgery (instead of oral diabetes medicines) to make sure you have good blood sugar levels. The goal for blood sugar control after surgery is 80-180 mg/dL.     After your COVID test   You are not required to quarantine however you are required to wear a well-fitting mask when you are out and around people not in your household.  If your mask becomes wet or soiled, replace with a new one.  Wash your hands often with soap and water for 20 seconds or clean your hands with an alcohol-based hand sanitizer that contains at least 60% alcohol.  Do not share personal items.  Notify your provider: if you are in close contact with someone who has COVID  or if you develop a fever of 100.4 or greater, sneezing, cough, sore throat, shortness of breath or body aches.             Do not wear jewelry or makeup Do not wear lotions, powders, perfumes/colognes, or deodorant. Do not shave 48 hours prior to surgery.  Men may shave face and neck. Do not bring valuables to the hospital. DO Not wear nail polish, gel polish, artificial nails, or any other type of covering on natural nails including finger and toenails. If patients have artificial nails, gel coating, etc. that need to be removed by a nail salon, please have this removed prior to surgery or surgery may need to be canceled/delayed if the surgeon/ anesthesia feels like the patient is unable to be adequately monitored.             [facility] is not responsible for any belongings or valuables.  Do NOT Smoke (Tobacco/Vaping)  24 hours prior to your procedure  If you use a CPAP at night, you may bring your mask for your overnight stay.   Contacts, glasses, hearing aids, dentures or partials may not be worn into surgery, please bring cases for these belongings   For patients admitted to the  hospital, discharge time will be determined by your treatment team.   Patients discharged the day of surgery will not be allowed to drive home, and someone needs to stay with them for 24 hours.  NO VISITORS WILL BE ALLOWED IN PRE-OP WHERE PATIENTS ARE PREPPED FOR SURGERY.  ONLY 1 SUPPORT PERSON MAY BE PRESENT IN THE WAITING ROOM WHILE YOU ARE IN SURGERY.  IF YOU ARE TO BE ADMITTED, ONCE YOU ARE IN YOUR ROOM YOU WILL BE ALLOWED TWO (2) VISITORS. 1 (ONE) VISITOR MAY STAY OVERNIGHT BUT MUST ARRIVE TO THE ROOM BY 8pm.  Minor children may have two parents present. Special consideration for safety and communication needs will be reviewed on a case by case basis.  Special instructions:    Oral Hygiene is also important to reduce your risk of infection.  Remember -  BRUSH YOUR TEETH THE MORNING OF SURGERY WITH YOUR REGULAR TOOTHPASTE   [place]- Preparing For Surgery  Before surgery, you can play an important role. Because skin is not sterile, your skin needs to be as free of germs as possible. You can reduce the number of germs on your skin by washing with CHG (chlorahexidine gluconate) Soap before surgery.  CHG is an antiseptic cleaner which kills germs and bonds with the skin to continue killing germs even after washing.     Please do not use if you have an allergy to CHG or antibacterial soaps. If your skin becomes reddened/irritated stop using the CHG.  Do not shave (including legs and underarms) for at least 48 hours prior to first CHG shower. It is OK to shave your face.  Please follow these instructions carefully.     Shower the NIGHT BEFORE SURGERY and the MORNING OF SURGERY with CHG Soap.   If you chose to wash your hair, wash your hair first as usual with your normal shampoo. After you shampoo, rinse your hair and body thoroughly to remove the shampoo.  Then [employer] and genitals (private parts) with your normal soap and rinse thoroughly to remove soap.  After that Use CHG Soap as you  would any other liquid soap. You can apply CHG directly to the skin and wash gently with a scrungie or a clean washcloth.   Apply the CHG Soap to your body ONLY FROM THE NECK DOWN.  Do not use on open wounds or open sores. Avoid contact with your eyes, ears, mouth and genitals (private parts). Wash Face and genitals (private parts)  with your normal soap.   Wash thoroughly, paying special attention to the area where your surgery will be performed.  Thoroughly rinse your body with warm water from the neck down.  DO NOT shower/wash with your normal soap after using and rinsing off the CHG Soap.  Pat yourself dry with a CLEAN TOWEL.  Wear CLEAN PAJAMAS to bed the night before surgery  Place CLEAN SHEETS on your bed the night before your surgery  DO NOT SLEEP WITH PETS.   Day of Surgery:  Take a shower with CHG soap. Wear Clean/Comfortable clothing the morning of surgery Do not apply any deodorants/lotions.   Remember to brush your teeth WITH YOUR REGULAR TOOTHPASTE.   Please read over the following fact sheets that you were given.

## 2021-09-03 ENCOUNTER — Encounter (HOSPITAL_COMMUNITY)
Admission: RE | Admit: 2021-09-03 | Discharge: 2021-09-03 | Disposition: A | Discharge status: Home / Self Care | Payer: BC Managed Care – PPO | Source: Ambulatory Visit | Attending: Orthopedic Surgery | Admitting: Orthopedic Surgery

## 2021-09-03 ENCOUNTER — Other Ambulatory Visit: Payer: Self-pay | Admitting: Family

## 2021-09-03 ENCOUNTER — Other Ambulatory Visit: Payer: Self-pay

## 2021-09-03 ENCOUNTER — Encounter (HOSPITAL_COMMUNITY): Payer: Self-pay

## 2021-09-03 VITALS — BP 142/79 | HR 88 | Temp 98.3°F | Resp 19 | Ht 62.0 in | Wt 204.5 lb

## 2021-09-03 DIAGNOSIS — Z01818 Encounter for other preprocedural examination: Secondary | ICD-10-CM | POA: Diagnosis not present

## 2021-09-03 DIAGNOSIS — Z20822 Contact with and (suspected) exposure to covid-19: Secondary | ICD-10-CM | POA: Diagnosis not present

## 2021-09-03 DIAGNOSIS — E1169 Type 2 diabetes mellitus with other specified complication: Secondary | ICD-10-CM | POA: Diagnosis not present

## 2021-09-03 HISTORY — DX: Essential (primary) hypertension: I10

## 2021-09-03 LAB — CBC
HCT: 46.2 % (ref 39.0–52.0)
Hemoglobin: 15.6 g/dL (ref 13.0–17.0)
MCH: 29.8 pg (ref 26.0–34.0)
MCHC: 33.8 g/dL (ref 30.0–36.0)
MCV: 88.2 fL (ref 80.0–100.0)
Platelets: 249 10*3/uL (ref 150–400)
RBC: 5.24 MIL/uL (ref 4.22–5.81)
RDW: 14.6 % (ref 11.5–15.5)
WBC: 9.9 10*3/uL (ref 4.0–10.5)
nRBC: 0 % (ref 0.0–0.2)

## 2021-09-03 LAB — HEMOGLOBIN A1C
Hgb A1c MFr Bld: 7.3 % — ABNORMAL HIGH (ref 4.8–5.6)
Mean Plasma Glucose: 162.81 mg/dL

## 2021-09-03 LAB — BASIC METABOLIC PANEL
Anion gap: 13 (ref 5–15)
BUN: 12 mg/dL (ref 8–23)
CO2: 23 mmol/L (ref 22–32)
Calcium: 9.6 mg/dL (ref 8.9–10.3)
Chloride: 100 mmol/L (ref 98–111)
Creatinine, Ser: 0.86 mg/dL (ref 0.61–1.24)
GFR, Estimated: >60 mL/min (ref >60)
Glucose, Bld: 164 mg/dL — ABNORMAL HIGH (ref 70–99)
Potassium: 3.6 mmol/L (ref 3.5–5.1)
Sodium: 136 mmol/L (ref 135–145)

## 2021-09-03 LAB — SURGICAL PCR SCREEN
MRSA, PCR: NEGATIVE
Staphylococcus aureus: NEGATIVE

## 2021-09-03 LAB — GLUCOSE, CAPILLARY: Glucose-Capillary: 192 mg/dL — ABNORMAL HIGH (ref 70–99)

## 2021-09-03 LAB — SARS CORONAVIRUS 2 (TAT 6-24 HRS): SARS Coronavirus 2: NEGATIVE

## 2021-09-03 NOTE — Progress Notes (Signed)
PCP - daniel paterson Cardiologist - denies  PPM/ICD - denies   Chest x-ray -  EKG - 02/25/21 Stress Test - denies ECHO - denies Cardiac Cath - denies  Sleep Study - denies   Fasting Blood Sugar - 120-130s Checks Blood Sugar twice a day  As of today, STOP taking any Aspirin (unless otherwise instructed by your surgeon) Aleve, Naproxen, Ibuprofen, Motrin, Advil, Goody's, BC's, all herbal medications, fish oil, and all vitamins.  ERAS Protcol -yes PRE-SURGERY Ensure or G2- gatorade give  COVID TEST- completed in PAT 09/03/21   Anesthesia review: no  Patient denies shortness of breath, fever, cough and chest pain at PAT appointment   All instructions explained to the patient, with a verbal understanding of the material. Patient agrees to go over the instructions while at home for a better understanding. Patient also instructed to self quarantine after being tested for COVID-19. The opportunity to ask questions was provided.

## 2021-09-05 MED ORDER — TRANEXAMIC ACID 1000 MG/10ML IV SOLN
2000.0000 mg | INTRAVENOUS | Status: DC
  Filled 2021-09-05: qty 20

## 2021-09-05 NOTE — Anesthesia Preprocedure Evaluation (Addendum)
Anesthesia Evaluation  Patient identified by MRN, date of birth, ID band Patient awake    Reviewed: Allergy & Precautions, NPO status , Patient's Chart, lab work & pertinent test results  Airway Mallampati: III  TM Distance: >3 FB Neck ROM: Full    Dental  (+) Edentulous Upper, Edentulous Lower, Dental Advisory Given   Pulmonary neg pulmonary ROS, Current Smoker,    Pulmonary exam normal breath sounds clear to auscultation       Cardiovascular hypertension, Pt. on medications Normal cardiovascular exam Rhythm:Regular Rate:Normal     Neuro/Psych negative neurological ROS  negative psych ROS   GI/Hepatic negative GI ROS, Neg liver ROS,   Endo/Other  diabetes, Type 2, Oral Hypoglycemic Agents, Insulin Dependent  Renal/GU negative Renal ROS  negative genitourinary   Musculoskeletal  (+) Arthritis ,   Abdominal   Peds  Hematology negative hematology ROS (+)   Anesthesia Other Findings   Reproductive/Obstetrics                            Anesthesia Physical Anesthesia Plan  ASA: 2  Anesthesia Plan: Spinal and Regional   Post-op Pain Management:  Regional for Post-op pain   Induction:   PONV Risk Score and Plan: Treatment may vary due to age or medical condition  Airway Management Planned: Natural Airway  Additional Equipment:   Intra-op Plan:   Post-operative Plan:   Informed Consent: I have reviewed the patients History and Physical, chart, labs and discussed the procedure including the risks, benefits and alternatives for the proposed anesthesia with the patient or authorized representative who has indicated his/her understanding and acceptance.     Dental advisory given  Plan Discussed with: CRNA  Anesthesia Plan Comments:         Anesthesia Quick Evaluation

## 2021-09-06 ENCOUNTER — Encounter (HOSPITAL_COMMUNITY): Payer: Self-pay | Admitting: Orthopedic Surgery

## 2021-09-06 ENCOUNTER — Ambulatory Visit (HOSPITAL_COMMUNITY): Payer: BC Managed Care – PPO | Admitting: Anesthesiology

## 2021-09-06 ENCOUNTER — Encounter (HOSPITAL_COMMUNITY)
Admission: RE | Disposition: A | Discharge status: Home / Self Care | Payer: Self-pay | Source: Home / Self Care | Attending: Orthopedic Surgery

## 2021-09-06 ENCOUNTER — Observation Stay (HOSPITAL_COMMUNITY)
Admission: RE | Admit: 2021-09-06 | Discharge: 2021-09-07 | Disposition: A | Discharge status: Home / Self Care | Payer: BC Managed Care – PPO | Attending: Orthopedic Surgery | Admitting: Orthopedic Surgery

## 2021-09-06 DIAGNOSIS — E119 Type 2 diabetes mellitus without complications: Secondary | ICD-10-CM | POA: Diagnosis not present

## 2021-09-06 DIAGNOSIS — F17210 Nicotine dependence, cigarettes, uncomplicated: Secondary | ICD-10-CM | POA: Diagnosis not present

## 2021-09-06 DIAGNOSIS — Z96652 Presence of left artificial knee joint: Secondary | ICD-10-CM | POA: Diagnosis not present

## 2021-09-06 DIAGNOSIS — G8918 Other acute postprocedural pain: Secondary | ICD-10-CM | POA: Diagnosis not present

## 2021-09-06 DIAGNOSIS — M1711 Unilateral primary osteoarthritis, right knee: Secondary | ICD-10-CM | POA: Diagnosis not present

## 2021-09-06 DIAGNOSIS — I10 Essential (primary) hypertension: Secondary | ICD-10-CM | POA: Diagnosis not present

## 2021-09-06 DIAGNOSIS — Z7984 Long term (current) use of oral hypoglycemic drugs: Secondary | ICD-10-CM | POA: Diagnosis not present

## 2021-09-06 DIAGNOSIS — Z79899 Other long term (current) drug therapy: Secondary | ICD-10-CM | POA: Diagnosis not present

## 2021-09-06 DIAGNOSIS — Z96651 Presence of right artificial knee joint: Secondary | ICD-10-CM

## 2021-09-06 HISTORY — PX: TOTAL KNEE ARTHROPLASTY: SHX125

## 2021-09-06 LAB — GLUCOSE, CAPILLARY
Glucose-Capillary: 170 mg/dL — ABNORMAL HIGH (ref 70–99)
Glucose-Capillary: 162 mg/dL — ABNORMAL HIGH (ref 70–99)
Glucose-Capillary: 302 mg/dL — ABNORMAL HIGH (ref 70–99)
Glucose-Capillary: 236 mg/dL — ABNORMAL HIGH (ref 70–99)
Glucose-Capillary: 240 mg/dL — ABNORMAL HIGH (ref 70–99)

## 2021-09-06 SURGERY — ARTHROPLASTY, KNEE, TOTAL
Anesthesia: Regional | Site: Knee | Laterality: Right

## 2021-09-06 MED ORDER — ONDANSETRON HCL 4 MG/2ML IJ SOLN
INTRAMUSCULAR | Status: AC
  Filled 2021-09-06: qty 2

## 2021-09-06 MED ORDER — LACTATED RINGERS IV SOLN: INTRAVENOUS | Status: DC

## 2021-09-06 MED ORDER — ONDANSETRON HCL 4 MG/2ML IJ SOLN: 4.0000 mg | Freq: Four times a day (QID) | INTRAMUSCULAR | Status: DC | PRN

## 2021-09-06 MED ORDER — PHENYLEPHRINE HCL-NACL 20-0.9 MG/250ML-% IV SOLN
INTRAVENOUS | Status: DC | PRN
  Administered 2021-09-06: 50 ug/min via INTRAVENOUS

## 2021-09-06 MED ORDER — HYDROMORPHONE HCL 1 MG/ML IJ SOLN
0.5000 mg | INTRAMUSCULAR | Status: DC | PRN
  Administered 2021-09-06: 0.5 mg via INTRAVENOUS
  Administered 2021-09-06 (×2): 1 mg via INTRAVENOUS
  Administered 2021-09-06: 0.5 mg via INTRAVENOUS
  Administered 2021-09-07 (×2): 1 mg via INTRAVENOUS
  Filled 2021-09-06 (×5): qty 1

## 2021-09-06 MED ORDER — OXYCODONE HCL 5 MG PO TABS
ORAL_TABLET | ORAL | Status: AC
  Filled 2021-09-06: qty 1

## 2021-09-06 MED ORDER — OXYCODONE HCL 5 MG PO TABS
5.0000 mg | ORAL_TABLET | ORAL | Status: DC | PRN
  Administered 2021-09-06 (×2): 5 mg via ORAL
  Filled 2021-09-06 (×3): qty 2

## 2021-09-06 MED ORDER — DEXMEDETOMIDINE HCL IN NACL 200 MCG/50ML IV SOLN
INTRAVENOUS | Status: DC | PRN
  Administered 2021-09-06: 8 ug via INTRAVENOUS

## 2021-09-06 MED ORDER — METOCLOPRAMIDE HCL 5 MG/ML IJ SOLN: 5.0000 mg | Freq: Three times a day (TID) | INTRAMUSCULAR | Status: DC | PRN

## 2021-09-06 MED ORDER — SODIUM CHLORIDE 0.9 % IV SOLN: INTRAVENOUS | Status: DC

## 2021-09-06 MED ORDER — ACETAMINOPHEN 325 MG PO TABS: 325.0000 mg | ORAL_TABLET | Freq: Four times a day (QID) | ORAL | Status: DC | PRN

## 2021-09-06 MED ORDER — ROSUVASTATIN CALCIUM 20 MG PO TABS
20.0000 mg | ORAL_TABLET | Freq: Every day | ORAL | Status: DC
  Administered 2021-09-06 – 2021-09-07 (×2): 20 mg via ORAL
  Filled 2021-09-06 (×2): qty 1

## 2021-09-06 MED ORDER — MIDAZOLAM HCL 5 MG/5ML IJ SOLN
INTRAMUSCULAR | Status: DC | PRN
  Administered 2021-09-06: 2 mg via INTRAVENOUS

## 2021-09-06 MED ORDER — FENTANYL CITRATE (PF) 250 MCG/5ML IJ SOLN
INTRAMUSCULAR | Status: DC | PRN
  Administered 2021-09-06: 50 ug via INTRAVENOUS

## 2021-09-06 MED ORDER — METHOCARBAMOL 500 MG PO TABS: 500.0000 mg | ORAL_TABLET | Freq: Four times a day (QID) | ORAL | Status: DC | PRN

## 2021-09-06 MED ORDER — TAMSULOSIN HCL 0.4 MG PO CAPS
0.4000 mg | ORAL_CAPSULE | Freq: Every day | ORAL | Status: DC
  Administered 2021-09-06: 0.4 mg via ORAL
  Filled 2021-09-06: qty 1

## 2021-09-06 MED ORDER — METFORMIN HCL 500 MG PO TABS
1000.0000 mg | ORAL_TABLET | Freq: Two times a day (BID) | ORAL | Status: DC
  Administered 2021-09-06 – 2021-09-07 (×2): 1000 mg via ORAL
  Filled 2021-09-06 (×2): qty 2

## 2021-09-06 MED ORDER — INSULIN ASPART 100 UNIT/ML IJ SOLN
0.0000 [IU] | Freq: Three times a day (TID) | INTRAMUSCULAR | Status: DC
  Administered 2021-09-06: 5 [IU] via SUBCUTANEOUS
  Administered 2021-09-06: 11 [IU] via SUBCUTANEOUS
  Administered 2021-09-07: 5 [IU] via SUBCUTANEOUS

## 2021-09-06 MED ORDER — DIPHENHYDRAMINE HCL 12.5 MG/5ML PO ELIX: 12.5000 mg | ORAL_SOLUTION | ORAL | Status: DC | PRN

## 2021-09-06 MED ORDER — FENTANYL CITRATE (PF) 100 MCG/2ML IJ SOLN: 25.0000 ug | INTRAMUSCULAR | Status: DC | PRN

## 2021-09-06 MED ORDER — INSULIN ASPART 100 UNIT/ML IJ SOLN
INTRAMUSCULAR | Status: AC
  Filled 2021-09-06: qty 1

## 2021-09-06 MED ORDER — BISACODYL 10 MG RE SUPP: 10.0000 mg | Freq: Every day | RECTAL | Status: DC | PRN

## 2021-09-06 MED ORDER — ALLOPURINOL 300 MG PO TABS
300.0000 mg | ORAL_TABLET | Freq: Every day | ORAL | Status: DC
  Administered 2021-09-07: 300 mg via ORAL
  Filled 2021-09-06 (×2): qty 1

## 2021-09-06 MED ORDER — MIDAZOLAM HCL 2 MG/2ML IJ SOLN
INTRAMUSCULAR | Status: AC
  Filled 2021-09-06: qty 2

## 2021-09-06 MED ORDER — PHENOL 1.4 % MT LIQD: 1.0000 | OROMUCOSAL | Status: DC | PRN

## 2021-09-06 MED ORDER — TRANEXAMIC ACID-NACL 1000-0.7 MG/100ML-% IV SOLN
INTRAVENOUS | Status: AC
  Filled 2021-09-06: qty 100

## 2021-09-06 MED ORDER — LIDOCAINE 2% (20 MG/ML) 5 ML SYRINGE
INTRAMUSCULAR | Status: DC | PRN
  Administered 2021-09-06: 40 mg via INTRAVENOUS

## 2021-09-06 MED ORDER — BUPIVACAINE IN DEXTROSE 0.75-8.25 % IT SOLN
INTRATHECAL | Status: DC | PRN
  Administered 2021-09-06: 1.6 mL via INTRATHECAL

## 2021-09-06 MED ORDER — ONDANSETRON HCL 4 MG PO TABS: 4.0000 mg | ORAL_TABLET | Freq: Four times a day (QID) | ORAL | Status: DC | PRN

## 2021-09-06 MED ORDER — ACETAMINOPHEN 500 MG PO TABS
ORAL_TABLET | ORAL | Status: AC
  Filled 2021-09-06: qty 2

## 2021-09-06 MED ORDER — DEXMEDETOMIDINE (PRECEDEX) IN NS 20 MCG/5ML (4 MCG/ML) IV SYRINGE
PREFILLED_SYRINGE | INTRAVENOUS | Status: AC
  Filled 2021-09-06: qty 5

## 2021-09-06 MED ORDER — ORAL CARE MOUTH RINSE: 15.0000 mL | Freq: Once | OROMUCOSAL | Status: AC

## 2021-09-06 MED ORDER — DOCUSATE SODIUM 100 MG PO CAPS
100.0000 mg | ORAL_CAPSULE | Freq: Two times a day (BID) | ORAL | Status: DC
  Administered 2021-09-06: 100 mg via ORAL
  Filled 2021-09-06 (×2): qty 1

## 2021-09-06 MED ORDER — PROPOFOL 10 MG/ML IV BOLUS
INTRAVENOUS | Status: DC | PRN
  Administered 2021-09-06: 50 mg via INTRAVENOUS

## 2021-09-06 MED ORDER — PROPOFOL 10 MG/ML IV BOLUS
INTRAVENOUS | Status: AC
  Filled 2021-09-06: qty 20

## 2021-09-06 MED ORDER — ONDANSETRON HCL 4 MG/2ML IJ SOLN
INTRAMUSCULAR | Status: DC | PRN
  Administered 2021-09-06: 4 mg via INTRAVENOUS

## 2021-09-06 MED ORDER — OXYCODONE HCL 5 MG PO TABS
10.0000 mg | ORAL_TABLET | ORAL | Status: DC | PRN
  Administered 2021-09-06 (×2): 10 mg via ORAL
  Administered 2021-09-07: 15 mg via ORAL
  Filled 2021-09-06: qty 3

## 2021-09-06 MED ORDER — PROPOFOL 500 MG/50ML IV EMUL
INTRAVENOUS | Status: DC | PRN
  Administered 2021-09-06: 25 ug/kg/min via INTRAVENOUS

## 2021-09-06 MED ORDER — FENTANYL CITRATE (PF) 250 MCG/5ML IJ SOLN
INTRAMUSCULAR | Status: AC
  Filled 2021-09-06: qty 5

## 2021-09-06 MED ORDER — MENTHOL 3 MG MT LOZG: 1.0000 | LOZENGE | OROMUCOSAL | Status: DC | PRN

## 2021-09-06 MED ORDER — CHLORTHALIDONE 25 MG PO TABS
12.5000 mg | ORAL_TABLET | Freq: Every day | ORAL | Status: DC
  Administered 2021-09-06 – 2021-09-07 (×2): 12.5 mg via ORAL
  Filled 2021-09-06 (×2): qty 0.5

## 2021-09-06 MED ORDER — POLYETHYLENE GLYCOL 3350 17 G PO PACK: 17.0000 g | PACK | Freq: Every day | ORAL | Status: DC | PRN

## 2021-09-06 MED ORDER — ROPIVACAINE HCL 5 MG/ML IJ SOLN
INTRAMUSCULAR | Status: DC | PRN
  Administered 2021-09-06: 20 mL via PERINEURAL

## 2021-09-06 MED ORDER — 0.9 % SODIUM CHLORIDE (POUR BTL) OPTIME
TOPICAL | Status: DC | PRN
  Administered 2021-09-06: 1000 mL

## 2021-09-06 MED ORDER — METOCLOPRAMIDE HCL 5 MG PO TABS: 5.0000 mg | ORAL_TABLET | Freq: Three times a day (TID) | ORAL | Status: DC | PRN

## 2021-09-06 MED ORDER — DAPAGLIFLOZIN PROPANEDIOL 10 MG PO TABS
10.0000 mg | ORAL_TABLET | Freq: Every morning | ORAL | Status: DC
  Administered 2021-09-07: 10 mg via ORAL
  Filled 2021-09-06: qty 1

## 2021-09-06 MED ORDER — ACETAMINOPHEN 500 MG PO TABS: 1000.0000 mg | ORAL_TABLET | Freq: Once | ORAL | Status: DC

## 2021-09-06 MED ORDER — METHOCARBAMOL 1000 MG/10ML IJ SOLN
500.0000 mg | Freq: Four times a day (QID) | INTRAVENOUS | Status: DC | PRN
  Filled 2021-09-06: qty 5

## 2021-09-06 MED ORDER — TRANEXAMIC ACID 1000 MG/10ML IV SOLN
INTRAVENOUS | Status: DC | PRN
  Administered 2021-09-06: 2000 mg via TOPICAL

## 2021-09-06 MED ORDER — SODIUM CHLORIDE 0.9 % IR SOLN
Status: DC | PRN
  Administered 2021-09-06: 3000 mL

## 2021-09-06 MED ORDER — CHLORHEXIDINE GLUCONATE 0.12 % MT SOLN: 15.0000 mL | Freq: Once | OROMUCOSAL | Status: AC

## 2021-09-06 MED ORDER — LIDOCAINE 2% (20 MG/ML) 5 ML SYRINGE
INTRAMUSCULAR | Status: AC
  Filled 2021-09-06: qty 10

## 2021-09-06 MED ORDER — CHLORHEXIDINE GLUCONATE 0.12 % MT SOLN
OROMUCOSAL | Status: AC
  Administered 2021-09-06: 15 mL via OROMUCOSAL
  Filled 2021-09-06: qty 15

## 2021-09-06 MED ORDER — CEFAZOLIN SODIUM-DEXTROSE 1-4 GM/50ML-% IV SOLN
1.0000 g | Freq: Four times a day (QID) | INTRAVENOUS | Status: AC
  Administered 2021-09-06 (×2): 1 g via INTRAVENOUS
  Filled 2021-09-06 (×2): qty 50

## 2021-09-06 MED ORDER — DEXAMETHASONE SODIUM PHOSPHATE 10 MG/ML IJ SOLN
INTRAMUSCULAR | Status: DC | PRN
  Administered 2021-09-06: 5 mg

## 2021-09-06 MED ORDER — TRANEXAMIC ACID-NACL 1000-0.7 MG/100ML-% IV SOLN
1000.0000 mg | Freq: Once | INTRAVENOUS | Status: AC
  Administered 2021-09-06: 1000 mg via INTRAVENOUS
  Filled 2021-09-06 (×2): qty 100

## 2021-09-06 MED ORDER — TRANEXAMIC ACID-NACL 1000-0.7 MG/100ML-% IV SOLN
1000.0000 mg | INTRAVENOUS | Status: AC
  Administered 2021-09-06: 1000 mg via INTRAVENOUS

## 2021-09-06 MED ORDER — HYDROMORPHONE HCL 1 MG/ML IJ SOLN
INTRAMUSCULAR | Status: AC
  Filled 2021-09-06: qty 1

## 2021-09-06 MED ORDER — LOSARTAN POTASSIUM 50 MG PO TABS
100.0000 mg | ORAL_TABLET | Freq: Every day | ORAL | Status: DC
  Administered 2021-09-06 – 2021-09-07 (×2): 100 mg via ORAL
  Filled 2021-09-06 (×2): qty 2

## 2021-09-06 MED ORDER — ASPIRIN EC 325 MG PO TBEC
325.0000 mg | DELAYED_RELEASE_TABLET | Freq: Every day | ORAL | Status: DC
  Administered 2021-09-07: 325 mg via ORAL
  Filled 2021-09-06 (×2): qty 1

## 2021-09-06 MED ORDER — CEFAZOLIN SODIUM-DEXTROSE 2-4 GM/100ML-% IV SOLN
2.0000 g | INTRAVENOUS | Status: AC
  Administered 2021-09-06: 2 g via INTRAVENOUS

## 2021-09-06 MED ORDER — CEFAZOLIN SODIUM-DEXTROSE 2-4 GM/100ML-% IV SOLN
INTRAVENOUS | Status: AC
  Filled 2021-09-06: qty 100

## 2021-09-06 SURGICAL SUPPLY — 55 items
BAG COUNTER SPONGE SURGICOUNT (BAG) ×2 IMPLANT
BLADE SAGITTAL 25.0X1.19X90 (BLADE) ×2 IMPLANT
BLADE SAW SGTL 13X75X1.27 (BLADE) ×2 IMPLANT
BLADE SURG 21 STRL SS (BLADE) ×4 IMPLANT
BNDG COHESIVE 6X5 TAN NS LF (GAUZE/BANDAGES/DRESSINGS) ×2 IMPLANT
BNDG COHESIVE 6X5 TAN STRL LF (GAUZE/BANDAGES/DRESSINGS) ×4 IMPLANT
BNDG GAUZE ELAST 4 BULKY (GAUZE/BANDAGES/DRESSINGS) ×2 IMPLANT
BOWL SMART MIX CTS (DISPOSABLE) ×2 IMPLANT
COMP FEM POR CR SZ 10 RT (Joint) ×2 IMPLANT
COMP PATELLAR 10X35 METAL (Joint) ×2 IMPLANT
COMPONENT FEM POR CR SZ 10 RT (Joint) ×1 IMPLANT
COMPONENT PATELLAR 10X35 METAL (Joint) ×1 IMPLANT
COOLER ICEMAN CLASSIC (MISCELLANEOUS) ×2 IMPLANT
COVER SURGICAL LIGHT HANDLE (MISCELLANEOUS) ×2 IMPLANT
CUFF TOURN SGL QUICK 34 (TOURNIQUET CUFF) ×2
CUFF TOURN SGL QUICK 42 (TOURNIQUET CUFF) IMPLANT
CUFF TRNQT CYL 34X4.125X (TOURNIQUET CUFF) ×1 IMPLANT
DRAPE EXTREMITY T 121X128X90 (DISPOSABLE) ×2 IMPLANT
DRAPE HALF SHEET 40X57 (DRAPES) ×4 IMPLANT
DRAPE U-SHAPE 47X51 STRL (DRAPES) ×2 IMPLANT
DRSG ADAPTIC 3X8 NADH LF (GAUZE/BANDAGES/DRESSINGS) ×2 IMPLANT
DRSG PAD ABDOMINAL 8X10 ST (GAUZE/BANDAGES/DRESSINGS) ×2 IMPLANT
DURAPREP 26ML APPLICATOR (WOUND CARE) ×2 IMPLANT
ELECT REM PT RETURN 9FT ADLT (ELECTROSURGICAL) ×2
ELECTRODE REM PT RTRN 9FT ADLT (ELECTROSURGICAL) ×1 IMPLANT
FACESHIELD WRAPAROUND (MASK) ×2 IMPLANT
GAUZE SPONGE 4X4 12PLY STRL (GAUZE/BANDAGES/DRESSINGS) ×2 IMPLANT
GLOVE SURG ORTHO LTX SZ9 (GLOVE) ×2 IMPLANT
GLOVE SURG UNDER POLY LF SZ9 (GLOVE) ×2 IMPLANT
GOWN STRL REUS W/ TWL XL LVL3 (GOWN DISPOSABLE) ×2 IMPLANT
GOWN STRL REUS W/TWL XL LVL3 (GOWN DISPOSABLE) ×4
HANDPIECE INTERPULSE COAX TIP (DISPOSABLE) ×2
HDLS TROCR DRIL PIN KNEE 75 (PIN) ×2
INSERT TIB ARTI SZ8-11 RT 11 (Joint) ×2 IMPLANT
KIT BASIN OR (CUSTOM PROCEDURE TRAY) ×2 IMPLANT
KIT TURNOVER KIT B (KITS) ×2 IMPLANT
MANIFOLD NEPTUNE II (INSTRUMENTS) ×2 IMPLANT
NS IRRIG 1000ML POUR BTL (IV SOLUTION) ×2 IMPLANT
PACK TOTAL JOINT (CUSTOM PROCEDURE TRAY) ×2 IMPLANT
PAD ABD 8X10 STRL (GAUZE/BANDAGES/DRESSINGS) ×2 IMPLANT
PAD ARMBOARD 7.5X6 YLW CONV (MISCELLANEOUS) ×2 IMPLANT
PAD COLD SHLDR WRAP-ON (PAD) ×2 IMPLANT
PIN DRILL HDLS TROCAR 75 4PK (PIN) ×1 IMPLANT
SCREW FEMALE HEX FIX 25X2.5 (ORTHOPEDIC DISPOSABLE SUPPLIES) ×2 IMPLANT
SET HNDPC FAN SPRY TIP SCT (DISPOSABLE) ×1 IMPLANT
STAPLER VISISTAT 35W (STAPLE) ×2 IMPLANT
STEM TIBIAL TRAB SZF RT (Stem) ×2 IMPLANT
SUCTION FRAZIER HANDLE 10FR (MISCELLANEOUS)
SUCTION TUBE FRAZIER 10FR DISP (MISCELLANEOUS) IMPLANT
SUT VIC AB 0 CT1 27 (SUTURE) ×2
SUT VIC AB 0 CT1 27XBRD ANBCTR (SUTURE) ×1 IMPLANT
SUT VIC AB 1 CTX 36 (SUTURE)
SUT VIC AB 1 CTX36XBRD ANBCTR (SUTURE) IMPLANT
TOWEL GREEN STERILE (TOWEL DISPOSABLE) ×2 IMPLANT
TOWEL GREEN STERILE FF (TOWEL DISPOSABLE) ×2 IMPLANT

## 2021-09-06 NOTE — Evaluation (Signed)
Physical Therapy Evaluation Patient Details Name: Travis White MRN: [id number] DOB: 1961-08-11 Today's Date: 09/06/2021  History of Present Illness  Pt is 60 yo male s/p R TKA on 09/06/21.  Pt with hx of L TKA, DM, HTN  Clinical Impression  Pt is s/p TKA resulting in the deficits listed below (see PT Problem List). At baseline, pt independent and working.  He has support at home, does not have RW. Today, pt transferring and ambulating with supervision-min guard level.  Ambulated 100'.  Pt reports pain at 8/10 but tolerated mobility.  Expected to progress well.  Pt will benefit from skilled PT to increase their independence and safety with mobility to allow discharge to the venue listed below.         Recommendations for follow up therapy are one component of a multi-disciplinary discharge planning process, led by the attending physician.  Recommendations may be updated based on patient status, additional functional criteria and insurance authorization.  Follow Up Recommendations Follow physician's recommendations for discharge plan and follow up therapies    Assistance Recommended at Discharge Intermittent Supervision/Assistance  Functional Status Assessment Patient has had a recent decline in their functional status and demonstrates the ability to make significant improvements in function in a reasonable and predictable amount of time.  Equipment Recommendations   (youth RW)    Recommendations for Other Services       Precautions / Restrictions Precautions Precautions: Fall Restrictions Weight Bearing Restrictions: Yes RLE Weight Bearing: Weight bearing as tolerated      Mobility  Bed Mobility Overal bed mobility: Needs Assistance Bed Mobility: Supine to Sit     Supine to sit: Supervision     General bed mobility comments: Pt utilizing gait belt for AAROM on R without cues    Transfers Overall transfer level: Needs assistance Equipment used: Rolling walker (2  wheels) Transfers: Sit to/from Stand Sit to Stand: Min guard           General transfer comment: cues for hand placement; performed x 2    Ambulation/Gait Ambulation/Gait assistance: Min guard Gait Distance (Feet): 100 Feet Assistive device: Rolling walker (2 wheels) Gait Pattern/deviations: Step-to pattern;Decreased weight shift to right Gait velocity: decreased   General Gait Details: Pt with step to R gait; steady; min cues for RW proximity  Stairs            Wheelchair Mobility    Modified Rankin (Stroke Patients Only)       Balance Overall balance assessment: Needs assistance Sitting-balance support: No upper extremity supported Sitting balance-Leahy Scale: Good       Standing balance-Leahy Scale: Fair Standing balance comment: Ambulated with RW but could static stand without AD                             Pertinent Vitals/Pain Pain Assessment: 0-10 Pain Score: 9  Pain Location: R knee Pain Descriptors / Indicators: Aching;Discomfort;Sore Pain Intervention(s): Limited activity within patient's tolerance;Monitored during session;Ice applied;Premedicated before session    Home Living Family/patient expects to be discharged to:: Private residence Living Arrangements: Spouse/significant other Available Help at Discharge: Family;Available 24 hours/day Type of Home: House Home Access: Stairs to enter Entrance Stairs-Rails: Right;Left;Can reach both Entrance Stairs-Number of Steps: 5   Home Layout: One level Home Equipment: Cane - single point      Prior Function Prior Level of Function : Independent/Modified Independent  Mobility Comments: Completely independent; works in [employer]        Extremity/Trunk Assessment   Upper Extremity Assessment Upper Extremity Assessment: Overall WFL for tasks assessed    Lower Extremity Assessment Lower Extremity Assessment: LLE  deficits/detail;RLE deficits/detail RLE Deficits / Details: ROM: expected post op changes, knee 5 to 60 degrees limited by pain; MMT: ankle 5/5, knee 2/5, hip 2/5 LLE Deficits / Details: ROM WFL; MMT 5/5    Cervical / Trunk Assessment Cervical / Trunk Assessment: Normal  Communication   Communication: No difficulties  Cognition Arousal/Alertness: Awake/alert Behavior During Therapy: WFL for tasks assessed/performed Overall Cognitive Status: Within Functional Limits for tasks assessed                                          General Comments General comments (skin integrity, edema, etc.): Educated on PT role, POC, resting with leg straight, safe ice use, quad sets and ankle pumps tonight    Exercises     Assessment/Plan    PT Assessment Patient needs continued PT services  PT Problem List Decreased strength;Decreased mobility;Decreased safety awareness;Decreased range of motion;Decreased activity tolerance;Decreased balance;Decreased knowledge of use of DME;Pain       PT Treatment Interventions DME instruction;Therapeutic activities;Modalities;Gait training;Therapeutic exercise;Patient/family education;Stair training;Functional mobility training    PT Goals (Current goals can be found in the Care Plan section)  Acute Rehab PT Goals Patient Stated Goal: return home PT Goal Formulation: With patient Time For Goal Achievement: 09/20/21 Potential to Achieve Goals: Good    Frequency 7X/week   Barriers to discharge        Co-evaluation               AM-PAC PT "6 Clicks" Mobility  Outcome Measure Help needed turning from your back to your side while in a flat bed without using bedrails?: A Little Help needed moving from lying on your back to sitting on the side of a flat bed without using bedrails?: A Little Help needed moving to and from a bed to a chair (including a wheelchair)?: A Little Help needed standing up from a chair using your arms (e.g.,  wheelchair or bedside chair)?: A Little Help needed to walk in hospital room?: A Little Help needed climbing 3-5 steps with a railing? : A Little 6 Click Score: 18    End of Session Equipment Utilized During Treatment: Gait belt Activity Tolerance: Patient tolerated treatment well Patient left: in chair;with call bell/phone within reach (no alarm; mod fall risk; following all directions; good safety awareness; knows to call) Nurse Communication: Mobility status PT Visit Diagnosis: Other abnormalities of gait and mobility (R26.89);Muscle weakness (generalized) (M62.81)    Time: 5409-8119 PT Time Calculation (min) (ACUTE ONLY): 19 min   Charges:   PT Evaluation $PT Eval Low Complexity: 1 Low          Illyana Schorsch, PT Acute Rehab Services Pager [phone] [facility] [phone]   Rayetta Humphrey 09/06/2021, 5:45 PM

## 2021-09-06 NOTE — Transfer of Care (Signed)
Immediate Anesthesia Transfer of Care Note  Patient: Travis White  Procedure(s) Performed: RIGHT TOTAL KNEE ARTHROPLASTY (Right: Knee)  Patient Location: PACU  Anesthesia Type:Spinal  Level of Consciousness: awake, alert  and oriented  Airway & Oxygen Therapy: Patient Spontanous Breathing  Post-op Assessment: Report given to RN and Post -op Vital signs reviewed and stable  Post vital signs: Reviewed and stable  Last Vitals:  Vitals Value Taken Time  BP 99/62 09/06/21 0915  Temp 36.5 C 09/06/21 0915  Pulse 84 09/06/21 0918  Resp 21 09/06/21 0918  SpO2 93 % 09/06/21 0918  Vitals shown include unvalidated device data.  Last Pain:  Vitals:   09/06/21 0601  TempSrc:   PainSc: 3       Patients Stated Pain Goal: 3 (09/06/21 0601)  Complications: No notable events documented.

## 2021-09-06 NOTE — Anesthesia Procedure Notes (Signed)
Spinal  Patient location during procedure: OR Start time: 09/06/2021 7:33 AM End time: 09/06/2021 7:37 AM Reason for block: surgical anesthesia Staffing Performed: anesthesiologist  Anesthesiologist: Elmer Picker, MD Preanesthetic Checklist Completed: patient identified, IV checked, risks and benefits discussed, surgical consent, monitors and equipment checked, pre-op evaluation and timeout performed Spinal Block Patient position: sitting Prep: DuraPrep and site prepped and draped Patient monitoring: cardiac monitor, continuous pulse ox and blood pressure Approach: midline Location: L4-5 Injection technique: single-shot Needle Needle type: Pencan  Needle gauge: 24 G Needle length: 9 cm Assessment Sensory level: T6 Events: CSF return Additional Notes Functioning IV was confirmed and monitors were applied. Sterile prep and drape, including hand hygiene and sterile gloves were used. The patient was positioned and the spine was prepped. The skin was anesthetized with lidocaine.  Free flow of clear CSF was obtained prior to injecting local anesthetic into the CSF.  The spinal needle aspirated freely following injection.  The needle was carefully withdrawn.  The patient tolerated the procedure well.

## 2021-09-06 NOTE — Anesthesia Procedure Notes (Signed)
Anesthesia Regional Block: Adductor canal block   Pre-Anesthetic Checklist: , timeout performed,  Correct Patient, Correct Site, Correct Laterality,  Correct Procedure, Correct Position, site marked,  Risks and benefits discussed,  Surgical consent,  Pre-op evaluation,  At surgeon's request and post-op pain management  Laterality: Right  Prep: Maximum Sterile Barrier Precautions used, chloraprep       Needles:  Injection technique: Single-shot  Needle Type: Echogenic Stimulator Needle     Needle Length: 9cm  Needle Gauge: 22     Additional Needles:   Procedures:,,,, ultrasound used (permanent image in chart),,    Narrative:  Start time: 09/06/2021 7:20 AM End time: 09/06/2021 7:24 AM Injection made incrementally with aspirations every 5 mL.  Performed by: Personally  Anesthesiologist: Elmer Picker, MD  Additional Notes: Monitors applied. No increased pain on injection. No increased resistance to injection. Injection made in 5cc increments. Good needle visualization. Patient tolerated procedure well.

## 2021-09-06 NOTE — H&P (Addendum)
TOTAL KNEE ADMISSION H&P  Patient is being admitted for right total knee arthroplasty.  Subjective:  Chief Complaint:right knee pain.  HPI: Travis White, 60 y.o. male, has a history of pain and functional disability in the right knee due to arthritis and has failed non-surgical conservative treatments for greater than 12 weeks to includeNSAID's and/or analgesics, corticosteriod injections, use of assistive devices, weight reduction as appropriate, and activity modification.  Onset of symptoms was gradual, starting 8 years ago with gradually worsening course since that time. The patient noted no past surgery on the right knee(s).  Patient currently rates pain in the right knee(s) at 8 out of 10 with activity. Patient has night pain, worsening of pain with activity and weight bearing, pain that interferes with activities of daily living, pain with passive range of motion, crepitus, and joint swelling.  Patient has evidence of subchondral cysts, subchondral sclerosis, periarticular osteophytes, joint subluxation, and joint space narrowing by imaging studies. This patient has had avascular necrosis of the knee. There is no active infection.  Patient Active Problem List   Diagnosis Date Noted   Arthritis of left knee 02/27/2021   Unilateral primary osteoarthritis, left knee    Past Medical History:  Diagnosis Date   Arthritis    Diabetes mellitus    type II   Hypertension     Past Surgical History:  Procedure Laterality Date   Left elbow Left    TOTAL KNEE ARTHROPLASTY Left 02/27/2021   Procedure: LEFT TOTAL KNEE ARTHROPLASTY;  Surgeon: Nadara Mustard, MD;  Location: [facility] OR;  Service: Orthopedics;  Laterality: Left;    Current Facility-Administered Medications  Medication Dose Route Frequency Provider Last Rate Last Admin   acetaminophen (TYLENOL) tablet 1,000 mg  1,000 mg Oral Once Woodrum, Chelsey L, MD       ceFAZolin (ANCEF) 2-4 GM/100ML-% IVPB            ceFAZolin (ANCEF) IVPB 2g/100  mL premix  2 g Intravenous On Call to OR Adonis Huguenin, NP       lactated ringers infusion   Intravenous Continuous Jairo Ben, MD       tranexamic acid (CYKLOKAPRON) 1000MG /13mL IVPB            tranexamic acid (CYKLOKAPRON) 2,000 mg in sodium chloride 0.9 % 50 mL Topical Application  2,000 mg Topical To OR 80m, MD       tranexamic acid (CYKLOKAPRON) IVPB 1,000 mg  1,000 mg Intravenous To OR Nadara Mustard, NP       No Known Allergies  Social History   Tobacco Use   Smoking status: Every Day    Packs/day: 1.00    Types: Cigarettes   Smokeless tobacco: Never  Substance Use Topics   Alcohol use: No    History reviewed. No pertinent family history.   Review of Systems  All other systems reviewed and are negative.  Objective:  Physical Exam  Patient is alert oriented no adenopathy well-dressed normal affect normal respiratory effort.  He has an antalgic gait.  There is crepitation with range of motion.  There is stable collateral and cruciate ligaments.  There is a mild effusion there is tenderness to palpation in all 3 compartments.  There is no redness no cellulitis no signs of infection.  Vital signs in last 24 hours: Temp:  [97.5 F (36.4 C)] 97.5 F (36.4 C) (10/28 0543) Pulse Rate:  [77] 77 (10/28 0543) Resp:  [17] 17 (10/28 0543) BP: (147)/(63)  147/63 (10/28 0543) SpO2:  [96 %] 96 % (10/28 0543) Weight:  [89.8 kg] 89.8 kg (10/28 0543)  Labs:   Estimated body mass index is 36.21 kg/m as calculated from the following:   Height as of this encounter: 5\' 2"  (1.575 m).   Weight as of this encounter: 89.8 kg.   Imaging Review Plain radiographs demonstrate moderate degenerative joint disease of the right knee(s). The overall alignment ismild varus. The bone quality appears to be adequate for age and reported activity level.      Assessment/Plan:  End stage arthritis, right knee   The patient history, physical examination, clinical judgment of  the provider and imaging studies are consistent with end stage degenerative joint disease of the right knee(s) and total knee arthroplasty is deemed medically necessary. The treatment options including medical management, injection therapy arthroscopy and arthroplasty were discussed at length. The risks and benefits of total knee arthroplasty were presented and reviewed. The risks due to aseptic loosening, infection, stiffness, patella tracking problems, thromboembolic complications and other imponderables were discussed. The patient acknowledged the explanation, agreed to proceed with the plan and consent was signed. Patient is being admitted for inpatient treatment for surgery, pain control, PT, OT, prophylactic antibiotics, VTE prophylaxis, progressive ambulation and ADL's and discharge planning. The patient is planning to be discharged home with home health services     Patient's anticipated LOS is less than 2 midnights, meeting these requirements: - Younger than 37 - Lives within 1 hour of care - Has a competent adult at home to recover with post-op recover - NO history of  - Chronic pain requiring opiods  - Diabetes  - Coronary Artery Disease  - Heart failure  - Heart attack  - Stroke  - DVT/VTE  - Cardiac arrhythmia  - Respiratory Failure/COPD  - Renal failure  - Anemia  - Advanced Liver disease

## 2021-09-06 NOTE — Op Note (Signed)
DATE OF SURGERY:  09/06/2021  TIME: 7:03 PM  PATIENT NAME:  Travis White    AGE: 60 y.o.    PRE-OPERATIVE DIAGNOSIS:  Osteoarthritis Right Knee  POST-OPERATIVE DIAGNOSIS:  Osteoarthritis Right Knee  PROCEDURE:  Procedure(s): RIGHT TOTAL KNEE ARTHROPLASTY  SURGEON: Aldean Baker  ASSISTANT April Green  OPERATIVE IMPLANTS: Depuy , Posterior Stabilized.  Femur size 10, Tibia size F, Patella size 11, 1-peg oval button, with a 35 mm polyethylene insert.  @ENCIMAGES @     PREOPERATIVE INDICATIONS:   [name] is a 60 y.o. year old male with end stage degenerative arthritis of the knee who failed conservative treatment and elected for Total Knee Arthroplasty.   The risks, benefits, and alternatives were discussed at length including but not limited to the risks of infection, bleeding, nerve injury, stiffness, blood clots, the need for revision surgery, cardiopulmonary complications, among others, and they were willing to proceed.  OPERATIVE DESCRIPTION:  The patient was brought to the operative room and placed in a supine position.  Anesthesia was administered.  IV antibiotics were given.  IV TXA was initiated.  The lower extremity was prepped and draped in the usual sterile fashion.  77 was used to cover all exposed skin. Time out was performed.    Anterior quadriceps tendon splitting approach was performed.  The patella was everted and osteophytes were removed.  The anterior horn of the medial and lateral meniscus was removed.   The distal femur was opened with the drill and the intramedullary distal femoral cutting jig was utilized, set at 5 degrees valgus resecting 9 mm off the distal femur.  Care was taken to protect the collateral ligaments.  Then the extramedullary tibial cutting jig was utilized set for 3 degree posterior slope.  Care was taken during the cut to protect the medial and collateral ligaments.  The proximal tibia was removed along with the posterior  horns of the menisci.  The PCL was sacrificed.    The extensor gap was measured and was approximately 11 mm.    The distal femoral sizing jig was applied, taking care to avoid notching.  Then the 4-in-1 cutting jig was applied and the anterior and posterior femur was cut, along with the chamfer cuts.  All posterior osteophytes were removed.  The flexion gap was then measured and was symmetric with the extension gap.  The distal femoral preparation using the appropriate jig to prepare the box.  The patella was then measured, and cut with the saw.    The proximal tibia sized and prepared accordingly with the reamer and the punch, and then all components were trialed with the poly insert.  The knee was found to have stable balance and full motion.  The knee was irrigated with normal saline, topical 2 g TXA was used to soak the wound.  The above named components were then press fit into place.  The final polyethylene component was placed.  The knee was then taken through a range of motion and the patella tracked well and the knee irrigated copiously and the parapatellar and subcutaneous tissue closed with vicryl, and skin closed with staples..  A sterile dressing was applied and patient  was taken to the PACU in stable  condition.  There were no complications.  Total tourniquet time was 28 minutes.

## 2021-09-06 NOTE — Anesthesia Postprocedure Evaluation (Signed)
Anesthesia Post Note  Patient: Travis White  Procedure(s) Performed: RIGHT TOTAL KNEE ARTHROPLASTY (Right: Knee)     Patient location during evaluation: PACU Anesthesia Type: Regional and Spinal Level of consciousness: oriented and awake and alert Pain management: pain level controlled Vital Signs Assessment: post-procedure vital signs reviewed and stable Respiratory status: spontaneous breathing, respiratory function stable and patient connected to nasal cannula oxygen Cardiovascular status: blood pressure returned to baseline and stable Postop Assessment: no headache, no backache and no apparent nausea or vomiting Anesthetic complications: no   No notable events documented.  Last Vitals:  Vitals:   09/06/21 0945 09/06/21 1015  BP: 100/62 108/70  Pulse: 77 70  Resp: 18 14  Temp:    SpO2: 95% 93%    Last Pain:  Vitals:   09/06/21 0945  TempSrc:   PainSc: 0-No pain                 Roseana Rhine L Kalia Vahey

## 2021-09-06 NOTE — Anesthesia Procedure Notes (Addendum)
Procedure Name: MAC Date/Time: 09/06/2021 7:40 AM Performed by: Renato Shin, CRNA Pre-anesthesia Checklist: Patient identified, Emergency Drugs available, Suction available and Patient being monitored Patient Re-evaluated:Patient Re-evaluated prior to induction Oxygen Delivery Method: Simple face mask Preoxygenation: Pre-oxygenation with 100% oxygen Induction Type: IV induction Placement Confirmation: positive ETCO2 and breath sounds checked- equal and bilateral Dental Injury: Teeth and Oropharynx as per pre-operative assessment

## 2021-09-07 DIAGNOSIS — Z96652 Presence of left artificial knee joint: Secondary | ICD-10-CM | POA: Diagnosis not present

## 2021-09-07 DIAGNOSIS — E119 Type 2 diabetes mellitus without complications: Secondary | ICD-10-CM | POA: Diagnosis not present

## 2021-09-07 DIAGNOSIS — Z79899 Other long term (current) drug therapy: Secondary | ICD-10-CM | POA: Diagnosis not present

## 2021-09-07 DIAGNOSIS — M1711 Unilateral primary osteoarthritis, right knee: Secondary | ICD-10-CM | POA: Diagnosis not present

## 2021-09-07 DIAGNOSIS — F17210 Nicotine dependence, cigarettes, uncomplicated: Secondary | ICD-10-CM | POA: Diagnosis not present

## 2021-09-07 DIAGNOSIS — I10 Essential (primary) hypertension: Secondary | ICD-10-CM | POA: Diagnosis not present

## 2021-09-07 LAB — GLUCOSE, CAPILLARY: Glucose-Capillary: 223 mg/dL — ABNORMAL HIGH (ref 70–99)

## 2021-09-07 MED ORDER — OXYCODONE-ACETAMINOPHEN 5-325 MG PO TABS: 1.0000 | ORAL_TABLET | ORAL | 0 refills | Status: DC | PRN

## 2021-09-07 MED ORDER — ALUM & MAG HYDROXIDE-SIMETH 200-200-20 MG/5ML PO SUSP
30.0000 mL | Freq: Once | ORAL | Status: AC
  Administered 2021-09-07: 30 mL via ORAL
  Filled 2021-09-07: qty 30

## 2021-09-07 NOTE — TOC Transition Note (Signed)
Transition of Care [facility]) - CM/SW Discharge Note   Patient Details  Name: Travis White MRN: [id number] Date of Birth: [birth year]  [facility] of Care [facility]) CM/SW Contact:  Bess Kinds, RN Phone Number: [phone] 09/07/2021, 12:35 PM   Clinical Narrative:     [employer] notified of need for youth size RW for delivery to the room. [employer] notified of change in provider.         Patient Goals and CMS Choice        Discharge Placement                       Discharge Plan and Services                DME Arranged: Walker rolling DME Agency: [employer] Date DME Agency Contacted: 09/07/21 Time DME Agency Contacted: 1234 Representative spoke with at DME Agency: Vaughan Basta            Social Determinants of Health (SDOH) Interventions     Readmission Risk Interventions No flowsheet data found.

## 2021-09-07 NOTE — Progress Notes (Signed)
Physical Therapy Treatment Patient Details Name: Travis White MRN: [id number] DOB: [birth year] Today's Date: 09/07/2021   History of Present Illness Pt is 60 yo male s/p R TKA on 09/06/21.  Pt with hx of L TKA, DM, HTN    PT Comments    Patient progressing well towards physical therapy goals. Patient ambulating at supervision level with RW. Cues for heel strike to promote knee extension with ambulation. Educated patient on HEP and importance of mobility at discharge. Patient negotiated 2 stairs with bilateral handrails and supervision. D/c plan remains appropriate.     Recommendations for follow up therapy are one component of a multi-disciplinary discharge planning process, led by the attending physician.  Recommendations may be updated based on patient status, additional functional criteria and insurance authorization.  Follow Up Recommendations  Follow physician's recommendations for discharge plan and follow up therapies     Assistance Recommended at Discharge Intermittent Supervision/Assistance  Equipment Recommendations  Other (comment) (youth RW)    Recommendations for Other Services       Precautions / Restrictions Precautions Precautions: Fall Restrictions Weight Bearing Restrictions: Yes RLE Weight Bearing: Weight bearing as tolerated     Mobility  Bed Mobility Overal bed mobility: Needs Assistance Bed Mobility: Supine to Sit;Sit to Supine     Supine to sit: Supervision Sit to supine: Supervision   General bed mobility comments: use of gait belt to assist R LE off bed. Supervision for safety    Transfers Overall transfer level: Needs assistance Equipment used: Rolling Dekendrick Uzelac (2 wheels) Transfers: Sit to/from Stand Sit to Stand: Supervision           General transfer comment: supervision for safety    Ambulation/Gait Ambulation/Gait assistance: Supervision Gait Distance (Feet): 250 Feet Assistive device: Rolling Paulina Muchmore (2 wheels) Gait  Pattern/deviations: Step-through pattern;Decreased weight shift to right;Decreased step length - left;Decreased stance time - right;Knee flexed in stance - right;Trunk flexed Gait velocity: decreased   General Gait Details: cues for heel strike on R LE and upright posture. Short step through gait pattern with decreased stance time on R.   Stairs Stairs: Yes Stairs assistance: Supervision Stair Management: Two rails;Step to pattern;Forwards Number of Stairs: 2 General stair comments: instructed on up with good and down with surgical knee for safe stair negotiation. Supervision for safety   Wheelchair Mobility    Modified Rankin (Stroke Patients Only)       Balance Overall balance assessment: Mild deficits observed, not formally tested                                          Cognition Arousal/Alertness: Awake/alert Behavior During Therapy: WFL for tasks assessed/performed Overall Cognitive Status: Within Functional Limits for tasks assessed                                          Exercises Total Joint Exercises Ankle Circles/Pumps: Both;10 reps;Supine Quad Sets: 10 reps;Right;Supine Heel Slides: Right;5 reps;Supine Straight Leg Raises: Right;5 reps;Supine    General Comments        Pertinent Vitals/Pain Pain Assessment: Faces Faces Pain Scale: Hurts even more Pain Location: R knee Pain Descriptors / Indicators: Aching;Discomfort;Sore Pain Intervention(s): Monitored during session    Home Living  Prior Function            PT Goals (current goals can now be found in the care plan section) Acute Rehab PT Goals Patient Stated Goal: return home PT Goal Formulation: With patient Time For Goal Achievement: 09/20/21 Potential to Achieve Goals: Good Progress towards PT goals: Progressing toward goals    Frequency    7X/week      PT Plan Current plan remains appropriate     Co-evaluation              AM-PAC PT "6 Clicks" Mobility   Outcome Measure  Help needed turning from your back to your side while in a flat bed without using bedrails?: A Little Help needed moving from lying on your back to sitting on the side of a flat bed without using bedrails?: A Little Help needed moving to and from a bed to a chair (including a wheelchair)?: A Little Help needed standing up from a chair using your arms (e.g., wheelchair or bedside chair)?: A Little Help needed to walk in hospital room?: A Little Help needed climbing 3-5 steps with a railing? : A Little 6 Click Score: 18    End of Session   Activity Tolerance: Patient tolerated treatment well Patient left: in bed;with call bell/phone within reach Nurse Communication: Mobility status PT Visit Diagnosis: Other abnormalities of gait and mobility (R26.89);Muscle weakness (generalized) (M62.81)     Time: 3329-5188 PT Time Calculation (min) (ACUTE ONLY): 24 min  Charges:  $Gait Training: 8-22 mins $Therapeutic Exercise: 8-22 mins                     Adaysha Dubinsky A. Dan Humphreys PT, DPT Acute Rehabilitation Services Pager [phone] Office [phone]    Viviann Spare 09/07/2021, 9:33 AM

## 2021-09-07 NOTE — Progress Notes (Signed)
Patient ID: Travis White, male   DOB: Nov 05, 1961, 60 y.o.   MRN: [id number] Patient making good progress with therapy.  We will plan for discharge today.  Patient has no complaints.  The ice machine was off.

## 2021-09-07 NOTE — Discharge Summary (Signed)
Discharge Diagnoses:  Active Problems:   Total knee replacement status, right   Unilateral primary osteoarthritis, right knee   Surgeries: Procedure(s): RIGHT TOTAL KNEE ARTHROPLASTY on 09/06/2021    Consultants:   Discharged Condition: Improved  Hospital Course: Travis White is an 60 y.o. male who was admitted 09/06/2021 with a chief complaint of osteoarthritis right knee, with a final diagnosis of Osteoarthritis Right Knee.  Patient was brought to the operating room on 09/06/2021 and underwent Procedure(s): RIGHT TOTAL KNEE ARTHROPLASTY.    Patient was given perioperative antibiotics:  Anti-infectives (From admission, onward)    Start     Dose/Rate Route Frequency Ordered Stop   09/06/21 1600  ceFAZolin (ANCEF) IVPB 1 g/50 mL premix        1 g 100 mL/hr over 30 Minutes Intravenous Every 6 hours 09/06/21 1529 09/06/21 2103   09/06/21 0600  ceFAZolin (ANCEF) IVPB 2g/100 mL premix        2 g 200 mL/hr over 30 Minutes Intravenous On call to O.R. 09/06/21 [id number] 09/06/21 0820   09/06/21 0552  ceFAZolin (ANCEF) 2-4 GM/100ML-% IVPB       Note to Pharmacy: Samuella Cota   : cabinet override      09/06/21 0552 09/06/21 0801     .  Patient was given sequential compression devices, early ambulation, and aspirin for DVT prophylaxis.  Recent vital signs: Patient Vitals for the past 24 hrs:  BP Temp Temp src Pulse Resp SpO2  09/07/21 0722 (!) 116/55 97.7 F (36.5 C) Oral 92 16 94 %  09/07/21 0442 119/64 97.8 F (36.6 C) Oral 95 18 94 %  09/07/21 0017 125/60 98.3 F (36.8 C) -- 90 20 94 %  09/06/21 1958 138/73 97.9 F (36.6 C) -- 90 20 97 %  09/06/21 1529 (!) 146/74 97.7 F (36.5 C) Oral 89 16 95 %  09/06/21 1506 131/72 -- -- 86 16 99 %  09/06/21 1438 129/73 -- -- 85 12 94 %  09/06/21 1415 125/65 -- -- 83 11 94 %  09/06/21 1345 (!) 152/71 -- -- 81 16 94 %  09/06/21 1315 121/71 -- -- 82 13 95 %  09/06/21 1245 130/72 -- -- 78 15 95 %  09/06/21 1215 133/81 -- -- 77 14 96 %   09/06/21 1145 122/76 -- -- 75 14 96 %  .  Recent laboratory studies: No results found.  Discharge Medications:   Allergies as of 09/07/2021   No Known Allergies      Medication List     STOP taking these medications    oxyCODONE 5 MG immediate release tablet Commonly known as: Oxy IR/ROXICODONE       TAKE these medications    acetaminophen 650 MG CR tablet Commonly known as: TYLENOL Take 1,300 mg by mouth 2 (two) times daily.   allopurinol 300 MG tablet Commonly known as: ZYLOPRIM Take 300 mg by mouth daily.   aspirin 325 MG EC tablet Take 1 tablet (325 mg total) by mouth daily with breakfast.   B-D ULTRAFINE III SHORT PEN 31G X 8 MM Misc Generic drug: Insulin Pen Needle   chlorthalidone 25 MG tablet Commonly known as: HYGROTON Take 12.5 mg by mouth daily.   Farxiga 10 MG Tabs tablet Generic drug: dapagliflozin propanediol Take 10 mg by mouth every morning.   HumaLOG KwikPen 100 UNIT/ML KwikPen Generic drug: insulin lispro Inject 16 Units into the skin 2 (two) times daily with a meal.   losartan 100 MG tablet Commonly  known as: COZAAR Take 100 mg by mouth daily.   Magnesium 400 MG Tabs Take 400 mg by mouth daily.   metFORMIN 1000 MG tablet Commonly known as: GLUCOPHAGE Take 1,000 mg by mouth 2 (two) times daily with a meal.   oxyCODONE-acetaminophen 5-325 MG tablet Commonly known as: PERCOCET/ROXICET Take 1 tablet by mouth every 4 (four) hours as needed.   rosuvastatin 20 MG tablet Commonly known as: CRESTOR Take 20 mg by mouth daily.   Evaristo Bury FlexTouch 200 UNIT/ML FlexTouch Pen Generic drug: insulin degludec Inject 64 Units into the skin daily.   Trulicity 3 MG/0.5ML Sopn Generic drug: Dulaglutide Inject 3 mg into the skin every Friday.               Discharge Care Instructions  (From admission, onward)           Start     Ordered   09/07/21 0000  Weight bearing as tolerated       Question Answer Comment  Laterality  bilateral   Extremity Lower      09/07/21 1130            Diagnostic Studies: No results found.  Patient benefited maximally from their hospital stay and there were no complications.     Disposition: Discharge disposition: 01-Home or Self Care      Discharge Instructions     Call MD / Call 911   Complete by: As directed    If you experience chest pain or shortness of breath, CALL 911 and be transported to the hospital emergency room.  If you develope a fever above 101 F, pus (white drainage) or increased drainage or redness at the wound, or calf pain, call your surgeon's office.   Constipation Prevention   Complete by: As directed    Drink plenty of fluids.  Prune juice may be helpful.  You may use a stool softener, such as Colace (over the counter) 100 mg twice a day.  Use MiraLax (over the counter) for constipation as needed.   Diet - low sodium heart healthy   Complete by: As directed    Increase activity slowly as tolerated   Complete by: As directed    Post-operative opioid taper instructions:   Complete by: As directed    POST-OPERATIVE OPIOID TAPER INSTRUCTIONS: It is important to wean off of your opioid medication as soon as possible. If you do not need pain medication after your surgery it is ok to stop day one. Opioids include: Codeine, Hydrocodone(Norco, Vicodin), Oxycodone(Percocet, oxycontin) and hydromorphone amongst others.  Long term and even short term use of opiods can cause: Increased pain response Dependence Constipation Depression Respiratory depression And more.  Withdrawal symptoms can include Flu like symptoms Nausea, vomiting And more Techniques to manage these symptoms Hydrate well Eat regular healthy meals Stay active Use relaxation techniques(deep breathing, meditating, yoga) Do Not substitute Alcohol to help with tapering If you have been on opioids for less than two weeks and do not have pain than it is ok to stop all together.   Plan to wean off of opioids This plan should start within one week post op of your joint replacement. Maintain the same interval or time between taking each dose and first decrease the dose.  Cut the total daily intake of opioids by one tablet each day Next start to increase the time between doses. The last dose that should be eliminated is the evening dose.      Weight bearing as tolerated  Complete by: As directed    Laterality: bilateral   Extremity: Lower       Follow-up Information     Nadara Mustard, MD Follow up in 1 week(s).   Specialty: Orthopedic Surgery Contact information: [address] [place] [address] [phone]                  Signed: Nadara Mustard 09/07/2021, 11:31 AM

## 2021-09-08 NOTE — Progress Notes (Signed)
Discharge instructions reviewed and given, pt/wife acknowledge understanding. Pt informed case manager ordered walker and will be delivered to his home. Pt states md informed him he is to take ice man cooler home with him.

## 2021-09-12 ENCOUNTER — Encounter (HOSPITAL_COMMUNITY): Payer: Self-pay | Admitting: Orthopedic Surgery

## 2021-09-13 ENCOUNTER — Encounter: Payer: Self-pay | Admitting: Family

## 2021-09-13 ENCOUNTER — Telehealth: Payer: Self-pay | Admitting: Orthopedic Surgery

## 2021-09-13 ENCOUNTER — Ambulatory Visit (INDEPENDENT_AMBULATORY_CARE_PROVIDER_SITE_OTHER): Payer: BC Managed Care – PPO | Admitting: Family

## 2021-09-13 DIAGNOSIS — M1711 Unilateral primary osteoarthritis, right knee: Secondary | ICD-10-CM

## 2021-09-13 MED ORDER — OXYCODONE-ACETAMINOPHEN 5-325 MG PO TABS: 1.0000 | ORAL_TABLET | ORAL | 0 refills | Status: DC | PRN

## 2021-09-13 NOTE — Telephone Encounter (Signed)
Pt submitted medical release form and $25.00 cash payment to [employer]. [employer] forms faxed. Accepted 09/13/21

## 2021-09-18 NOTE — Progress Notes (Signed)
   Post-Op Visit Note   Patient: Travis White           Date of Birth: 1961-06-28           MRN: [id number] Visit Date: 09/13/2021 PCP: Jarome Matin, MD  Chief Complaint:  Chief Complaint  Patient presents with   Right Knee - Routine Post Op    Right total knee replacement 09/06/21    HPI:  HPI The patient is a 60 year old gentleman seen status post right total knee arthroplasty on October 28.  He is doing well has surgical dressing removed today. Ortho Exam Incision well approximated with staples there is no gaping no drainage no surrounding erythema lacks about 20 degrees of full extension  Visit Diagnoses: No diagnosis found.  Plan: Encouraged him to work aggressively with physical therapy on flexion and extension.  We will follow-up in the office in 1 week with radiographs of the knee and staple removal  Follow-Up Instructions: No follow-ups on file.   Imaging: No results found.  Orders:  No orders of the defined types were placed in this encounter.  Meds ordered this encounter  Medications   oxyCODONE-acetaminophen (PERCOCET/ROXICET) 5-325 MG tablet    Sig: Take 1 tablet by mouth every 4 (four) hours as needed.    Dispense:  30 tablet    Refill:  0     PMFS History: Patient Active Problem List   Diagnosis Date Noted   Total knee replacement status, right 09/06/2021   Unilateral primary osteoarthritis, right knee    Arthritis of left knee 02/27/2021   Unilateral primary osteoarthritis, left knee    Past Medical History:  Diagnosis Date   Arthritis    Diabetes mellitus    type II   Hypertension     History reviewed. No pertinent family history.  Past Surgical History:  Procedure Laterality Date   Left elbow Left    TOTAL KNEE ARTHROPLASTY Left 02/27/2021   Procedure: LEFT TOTAL KNEE ARTHROPLASTY;  Surgeon: Nadara Mustard, MD;  Location: [facility] OR;  Service: Orthopedics;  Laterality: Left;   TOTAL KNEE ARTHROPLASTY Right 09/06/2021   Procedure: RIGHT  TOTAL KNEE ARTHROPLASTY;  Surgeon: Nadara Mustard, MD;  Location: [facility] OR;  Service: Orthopedics;  Laterality: Right;   Social History   Occupational History   Not on file  Tobacco Use   Smoking status: Every Day    Packs/day: 1.00    Types: Cigarettes   Smokeless tobacco: Never  Vaping Use   Vaping Use: Never used  Substance and Sexual Activity   Alcohol use: No   Drug use: No   Sexual activity: Not on file

## 2021-09-20 ENCOUNTER — Ambulatory Visit (INDEPENDENT_AMBULATORY_CARE_PROVIDER_SITE_OTHER): Payer: BC Managed Care – PPO | Admitting: Family

## 2021-09-20 ENCOUNTER — Encounter: Payer: Self-pay | Admitting: Family

## 2021-09-20 ENCOUNTER — Ambulatory Visit: Payer: Self-pay

## 2021-09-20 ENCOUNTER — Other Ambulatory Visit: Payer: Self-pay

## 2021-09-20 ENCOUNTER — Telehealth: Payer: Self-pay | Admitting: Family

## 2021-09-20 DIAGNOSIS — Z96651 Presence of right artificial knee joint: Secondary | ICD-10-CM | POA: Diagnosis not present

## 2021-09-20 NOTE — Telephone Encounter (Signed)
Patient called advised the Rx for Oxycodone is not showing  received at the pharmacy yet. Patient asked for a call when Rx is sent to the pharmacy.   Patient uses [facility] on [address].  The number to contact patient is [phone]

## 2021-09-20 NOTE — Progress Notes (Signed)
   Post-Op Visit Note   Patient: Travis White           Date of Birth: 09/29/1961           MRN: [id number] Visit Date: 09/20/2021 PCP: Jarome Matin, MD  Chief Complaint:  Chief Complaint  Patient presents with   Right Knee - Routine Post Op    Right total knee replacment 09/06/21    HPI:  HPI The patient is a 60 year old gentleman seen 2 weeks status post right total knee arthroplasty doing well no concerns Ortho Exam On examination of the right knee total knee arthroplasty incision well-healed staples in place these were harvested today without incident and the patient has full extension flexion to 90 degrees.  Visit Diagnoses:  1. History of arthroplasty of right knee     Plan: Begin outpatient physical therapy.  Order placed today.  He will follow-up in the office in 4 weeks.  Follow-Up Instructions: No follow-ups on file.   Imaging: No results found.  Orders:  Orders Placed This Encounter  Procedures   XR KNEE 3 VIEW RIGHT   Ambulatory referral to Physical Therapy   No orders of the defined types were placed in this encounter.    PMFS History: Patient Active Problem List   Diagnosis Date Noted   Total knee replacement status, right 09/06/2021   Unilateral primary osteoarthritis, right knee    Arthritis of left knee 02/27/2021   Unilateral primary osteoarthritis, left knee    Past Medical History:  Diagnosis Date   Arthritis    Diabetes mellitus    type II   Hypertension     No family history on file.  Past Surgical History:  Procedure Laterality Date   Left elbow Left    TOTAL KNEE ARTHROPLASTY Left 02/27/2021   Procedure: LEFT TOTAL KNEE ARTHROPLASTY;  Surgeon: Nadara Mustard, MD;  Location: [facility] OR;  Service: Orthopedics;  Laterality: Left;   TOTAL KNEE ARTHROPLASTY Right 09/06/2021   Procedure: RIGHT TOTAL KNEE ARTHROPLASTY;  Surgeon: Nadara Mustard, MD;  Location: [facility] OR;  Service: Orthopedics;  Laterality: Right;   Social History    Occupational History   Not on file  Tobacco Use   Smoking status: Every Day    Packs/day: 1.00    Types: Cigarettes   Smokeless tobacco: Never  Vaping Use   Vaping Use: Never used  Substance and Sexual Activity   Alcohol use: No   Drug use: No   Sexual activity: Not on file

## 2021-09-23 ENCOUNTER — Other Ambulatory Visit: Payer: Self-pay | Admitting: Orthopedic Surgery

## 2021-09-23 MED ORDER — OXYCODONE-ACETAMINOPHEN 5-325 MG PO TABS: 1.0000 | ORAL_TABLET | ORAL | 0 refills | Status: DC | PRN

## 2021-09-23 NOTE — Telephone Encounter (Signed)
Pt called asking to have his oxycodone rx called in, he states his knee is in a lot of pain. He would like a CB asap please.   [phone]

## 2021-09-23 NOTE — Telephone Encounter (Signed)
Oxycodone 5-325mg  last filled 09/13/21 q4h prn #30

## 2021-09-23 NOTE — Telephone Encounter (Signed)
LM on VM that Rx sent to [facility] and should be ready for pick up. If any concerns call our office back.

## 2021-09-26 ENCOUNTER — Ambulatory Visit (INDEPENDENT_AMBULATORY_CARE_PROVIDER_SITE_OTHER): Payer: BC Managed Care – PPO | Admitting: Physical Therapy

## 2021-09-26 ENCOUNTER — Other Ambulatory Visit: Payer: Self-pay

## 2021-09-26 ENCOUNTER — Encounter: Payer: Self-pay | Admitting: Physical Therapy

## 2021-09-26 DIAGNOSIS — M25562 Pain in left knee: Secondary | ICD-10-CM

## 2021-09-26 DIAGNOSIS — M25662 Stiffness of left knee, not elsewhere classified: Secondary | ICD-10-CM

## 2021-09-26 DIAGNOSIS — R2689 Other abnormalities of gait and mobility: Secondary | ICD-10-CM

## 2021-09-26 DIAGNOSIS — M25561 Pain in right knee: Secondary | ICD-10-CM

## 2021-09-26 DIAGNOSIS — M6281 Muscle weakness (generalized): Secondary | ICD-10-CM

## 2021-09-26 DIAGNOSIS — M25661 Stiffness of right knee, not elsewhere classified: Secondary | ICD-10-CM

## 2021-09-26 DIAGNOSIS — R600 Localized edema: Secondary | ICD-10-CM

## 2021-09-26 NOTE — Patient Instructions (Signed)
Access Code: [email or web address] URL: [email or web address] Date: 09/26/2021 Prepared by: Moshe Cipro  Exercises Supine Quadricep Sets - 5-10 x daily - 7 x weekly - 1 sets - 5-10 reps - 5 sec hold Active Straight Leg Raise with Quad Set - 5-10 x daily - 7 x weekly - 1 sets - 5-10 reps Supine Heel Slide with Strap - 5-10 x daily - 7 x weekly - 1 sets - 10 reps Seated Knee Extension AROM - 5-10 x daily - 7 x weekly - 1 sets - 10 reps - 5 sec hold Seated Knee Flexion AAROM - 5-10 x daily - 7 x weekly - 1 sets - 10 reps - 10 sec hold

## 2021-09-26 NOTE — Therapy (Addendum)
[facility] Physical Therapy [address] [place], [place], [address] Phone: [phone]   Fax:  [phone]  Physical Therapy Evaluation  Patient Details  Name: Travis White MRN: [id number] Date of Birth: [birth year] Referring Provider (PT): Adonis Huguenin, NP   Encounter Date: 09/26/2021   PT End of Session - 09/26/21 1224     Visit Number 1    Number of Visits 16    Date for PT Re-Evaluation 11/21/21    Authorization Type BCBS    PT Start Time 1152    PT Stop Time 1223    PT Time Calculation (min) 31 min    Activity Tolerance Patient tolerated treatment well    Behavior During Therapy [facility] for tasks assessed/performed             Past Medical History:  Diagnosis Date   Arthritis    Diabetes mellitus    type II   Hypertension     Past Surgical History:  Procedure Laterality Date   Left elbow Left    TOTAL KNEE ARTHROPLASTY Left 02/27/2021   Procedure: LEFT TOTAL KNEE ARTHROPLASTY;  Surgeon: Nadara Mustard, MD;  Location: [facility] OR;  Service: Orthopedics;  Laterality: Left;   TOTAL KNEE ARTHROPLASTY Right 09/06/2021   Procedure: RIGHT TOTAL KNEE ARTHROPLASTY;  Surgeon: Nadara Mustard, MD;  Location: [facility] OR;  Service: Orthopedics;  Laterality: Right;    There were no vitals filed for this visit.    Subjective Assessment - 09/26/21 1153     Subjective Pt is a 60 y/o male who presents to OPPT s/p Rt TKA on 09/06/21.  He is amb with SPC and did not have HHPT.    Pertinent History OA, Lt TKA DM, HTN    Limitations Standing;Walking    Patient Stated Goals improve mobility, return to work    Currently in Pain? Yes    Pain Score 5    at best 3/10, up to 12/10   Pain Location Knee    Pain Orientation Right    Pain Descriptors / Indicators Aching    Pain Type Surgical pain;Acute pain    Pain Onset 1 to 4 weeks ago    Pain Frequency Constant    Aggravating Factors  walking on it, bending knee    Pain Relieving Factors rest, elevation, pain  medication                [facility] PT Assessment - 09/26/21 1156       Assessment   Medical Diagnosis Z96.651 (ICD-10-CM) - History of arthroplasty of right knee    Referring Provider (PT) Adonis Huguenin, NP    Onset Date/Surgical Date 09/06/21    Hand Dominance Right    Next MD Visit 10/17/21    Prior Therapy 15 OP visits for prior knee in 2022, 5 HHPT visits      Precautions   Precautions None      Restrictions   Weight Bearing Restrictions No      Balance Screen   Has the patient fallen in the past 6 months No    Has the patient had a decrease in activity level because of a fear of falling?  No    Is the patient reluctant to leave their home because of a fear of falling?  No      Home Nurse, mental health Private residence    Living Arrangements Spouse/significant other;Parent   mother - independent   Type of Home House  Home Access Stairs to enter    Entrance Stairs-Number of Steps 5    Entrance Stairs-Rails Right;Left;Can reach both    Home Layout One level    Home Equipment [place] - single point;Walker - 2 wheels      Prior Function   Level of Independence Independent    Vocation Full time employment    Advice worker, drives a forklift, some walking    Leisure fishing, no regular exercise      Cognition   Overall Cognitive Status Within Functional Limits for tasks assessed      Observation/Other Assessments   Observations Rt knee post op swelling present    Focus on Therapeutic Outcomes (FOTO)  44 (predicted 61)      ROM / Strength   AROM / PROM / Strength AROM;PROM;Strength      AROM   AROM Assessment Site Knee    Right/Left Knee Right    Right Knee Extension -9   seated LAQ   Right Knee Flexion 87      PROM   PROM Assessment Site Knee    Right/Left Knee Right    Right Knee Extension 0    Right Knee Flexion 93      Strength   Overall Strength Comments Rt knee 3-/5      Transfers   Comments mod I with UEs       Ambulation/Gait   Ambulation/Gait Yes    Ambulation/Gait Assistance 6: Modified independent (Device/Increase time)    Ambulation Distance (Feet) 100 Feet    Assistive device Straight cane    Gait Pattern Decreased stance time - right;Decreased step length - left;Decreased hip/knee flexion - right;Lateral trunk lean to left                        Objective measurements completed on examination: See above findings.       [facility] Adult PT Treatment/Exercise - 09/26/21 1156       Exercises   Exercises Other Exercises    Other Exercises  see pt instructions - pt performed reps PRN for instruction and understanding -mod cues needed                     PT Education - 09/26/21 1210     Education Details HEP    Person(s) Educated Patient    Methods Explanation;Demonstration;Handout    Comprehension Verbalized understanding;Returned demonstration;Need further instruction              PT Short Term Goals - 09/26/21 1226       PT SHORT TERM GOAL #1   Title Independent with initial HEP    Time 4    Period Weeks    Status New    Target Date 10/24/21      PT SHORT TERM GOAL #2   Title Rt knee AROM improved 0-100 for improved function    Time 4    Period Weeks    Status New    Target Date 10/24/21      PT SHORT TERM GOAL #3   Title -      PT SHORT TERM GOAL #4   Title -               PT Long Term Goals - 09/26/21 1228       PT LONG TERM GOAL #1   Title Independent with final HEP    Time 8    Period Weeks  Status New    Target Date 11/21/21      PT LONG TERM GOAL #2   Title Rt knee AROM improved 0-110 for improved function    Time 8    Period Weeks    Status New    Target Date 11/21/21      PT LONG TERM GOAL #3   Title Amb independently without significant deviations for improved function    Time 8    Period Weeks    Status New    Target Date 11/21/21      PT LONG TERM GOAL #4   Title Report pain < 2/10 for improved  function    Time 8    Period Weeks    Status New    Target Date 11/21/21      PT LONG TERM GOAL #5   Title FOTO score improved to 61 for improved function    Time 8    Period Weeks    Status New    Target Date 11/21/21                    Plan - 09/26/21 1225     Clinical Impression Statement Pt is a 60 y/o male who presents to OPPT s/p Rt TKA on 09/06/21.  He demonstrates decreased strength, ROM, increased edema and pain with gait abnormalities affecting functional mobility.  Pt will benefit from PT to address deficits listed.    Personal Factors and Comorbidities Comorbidity 3+;Past/Current Experience    Comorbidities OA, Lt TKA DM, HTN    Examination-Activity Limitations Locomotion Level;Transfers;Bend;Sit;Sleep;Squat;Stairs;Stand;Lift    Examination-Participation Restrictions Cleaning;Yard Work;Community Activity;Occupation    Stability/Clinical Decision Making Evolving/Moderate complexity    Clinical Decision Making Moderate    Rehab Potential Good    PT Frequency 2x / week    PT Duration 8 weeks    PT Treatment/Interventions ADLs/Self Care Home Management;Cryotherapy;Electrical Stimulation;Moist Heat;Ultrasound;DME Instruction;Balance training;Therapeutic exercise;Therapeutic activities;Functional mobility training;Stair training;Gait training;Neuromuscular re-education;Patient/family education;Manual techniques;Passive range of motion;Dry needling;Taping;Vasopneumatic Device    PT Next Visit Plan review HEP, continued work on ROM (flexion especially), balance/gait training    PT Home Exercise Plan Access Code: [email or web address]    Consulted and Agree with Plan of Care Patient             Patient will benefit from skilled therapeutic intervention in order to improve the following deficits and impairments:  Abnormal gait, Increased fascial restricitons, Pain, Decreased mobility, Decreased range of motion, Decreased endurance, Decreased strength, Impaired flexibility,  Difficulty walking, Decreased balance  Visit Diagnosis: Muscle weakness (generalized)  Acute pain of right knee  Stiffness of right knee, not elsewhere classified  Other abnormalities of gait and mobility  Localized edema     Problem List Patient Active Problem List   Diagnosis Date Noted   Total knee replacement status, right 09/06/2021   Unilateral primary osteoarthritis, right knee    Arthritis of left knee 02/27/2021   Unilateral primary osteoarthritis, left knee       Clarita Crane, PT, DPT 09/26/21 12:33 PM     [facility] Physical Therapy [address] [place], [place], [address] Phone: [phone]   Fax:  [phone]  Name: Travis White MRN: [id number] Date of Birth: 10/31/1961

## 2021-10-01 ENCOUNTER — Encounter: Payer: Self-pay | Admitting: Physical Therapy

## 2021-10-01 ENCOUNTER — Other Ambulatory Visit: Payer: Self-pay

## 2021-10-01 ENCOUNTER — Ambulatory Visit (INDEPENDENT_AMBULATORY_CARE_PROVIDER_SITE_OTHER): Payer: BC Managed Care – PPO | Admitting: Physical Therapy

## 2021-10-01 ENCOUNTER — Other Ambulatory Visit: Payer: Self-pay | Admitting: Orthopedic Surgery

## 2021-10-01 ENCOUNTER — Telehealth: Payer: Self-pay | Admitting: Orthopedic Surgery

## 2021-10-01 DIAGNOSIS — R2689 Other abnormalities of gait and mobility: Secondary | ICD-10-CM | POA: Diagnosis not present

## 2021-10-01 DIAGNOSIS — M25661 Stiffness of right knee, not elsewhere classified: Secondary | ICD-10-CM

## 2021-10-01 DIAGNOSIS — M25562 Pain in left knee: Secondary | ICD-10-CM | POA: Diagnosis not present

## 2021-10-01 DIAGNOSIS — M6281 Muscle weakness (generalized): Secondary | ICD-10-CM

## 2021-10-01 DIAGNOSIS — L0390 Cellulitis, unspecified: Secondary | ICD-10-CM | POA: Diagnosis not present

## 2021-10-01 DIAGNOSIS — R600 Localized edema: Secondary | ICD-10-CM

## 2021-10-01 DIAGNOSIS — M25561 Pain in right knee: Secondary | ICD-10-CM

## 2021-10-01 DIAGNOSIS — I10 Essential (primary) hypertension: Secondary | ICD-10-CM | POA: Diagnosis not present

## 2021-10-01 DIAGNOSIS — M25662 Stiffness of left knee, not elsewhere classified: Secondary | ICD-10-CM | POA: Diagnosis not present

## 2021-10-01 MED ORDER — OXYCODONE-ACETAMINOPHEN 5-325 MG PO TABS: 1.0000 | ORAL_TABLET | ORAL | 0 refills | Status: DC | PRN

## 2021-10-01 NOTE — Telephone Encounter (Signed)
Lm on vm that Rx sent in today

## 2021-10-01 NOTE — Therapy (Addendum)
[facility] Physical Therapy [address] [place], [place], [address] Phone: [phone]   Fax:  [phone]  Physical Therapy Treatment  Patient Details  Name: Travis White MRN: [id number] Date of Birth: [birth year] Referring Provider (PT): Suzan Slick, NP   Encounter Date: 10/01/2021   PT End of Session - 10/01/21 1355     Visit Number 2    Number of Visits 16    Date for PT Re-Evaluation 11/21/21    Authorization Type BCBS    PT Start Time 9735    PT Stop Time 1225    PT Time Calculation (min) 40 min    Activity Tolerance Patient tolerated treatment well    Behavior During Therapy [facility] for tasks assessed/performed             Past Medical History:  Diagnosis Date   Arthritis    Diabetes mellitus    type II   Hypertension     Past Surgical History:  Procedure Laterality Date   Left elbow Left    TOTAL KNEE ARTHROPLASTY Left 02/27/2021   Procedure: LEFT TOTAL KNEE ARTHROPLASTY;  Surgeon: Newt Minion, MD;  Location: [place];  Service: Orthopedics;  Laterality: Left;   TOTAL KNEE ARTHROPLASTY Right 09/06/2021   Procedure: RIGHT TOTAL KNEE ARTHROPLASTY;  Surgeon: Newt Minion, MD;  Location: [place];  Service: Orthopedics;  Laterality: Right;    There were no vitals filed for this visit.   Subjective Assessment - 10/01/21 1145     Subjective knee is doing "okay."  no new complaints    Pertinent History OA, Lt TKA DM, HTN    Limitations Standing;Walking    Patient Stated Goals improve mobility, return to work    Currently in Pain? Yes    Pain Score 5     Pain Location Knee    Pain Orientation Right    Pain Descriptors / Indicators Aching    Pain Type Acute pain;Surgical pain    Pain Onset 1 to 4 weeks ago    Pain Frequency Constant    Aggravating Factors  walking on it, bending knee    Pain Relieving Factors rest, elevation, pain meds                [facility] PT Assessment - 10/01/21 1213       Assessment   Medical  Diagnosis Z96.651 (ICD-10-CM) - History of arthroplasty of right knee    Referring Provider (PT) Suzan Slick, NP      AROM   Right Knee Flexion 112      PROM   Right Knee Flexion 114                           [facility] Adult PT Treatment/Exercise - 10/01/21 1200       Exercises   Exercises Knee/Hip      Knee/Hip Exercises: Stretches   Knee: Self-Stretch Limitations RLE 10 x 10 sec (overpressure from LLE)      Knee/Hip Exercises: Aerobic   Recumbent Bike partial revolutions x 8 min; seat 2      Knee/Hip Exercises: Machines for Strengthening   Total Gym Leg Press bil push 81# 3x10; RLE only 50# 3x10      Knee/Hip Exercises: Standing   Lateral Step Up Right;2 sets;10 reps;Hand Hold: 1;Step Height: 6"    Forward Step Up Right;2 sets;10 reps;Hand Hold: 1;Step Height: 6"      Knee/Hip Exercises: Seated  Long Arc [employer] Right;3 sets;10 reps;Weights    Long Arc Quad Weight 3 lbs.      Knee/Hip Exercises: Supine   Quad Sets Right;20 reps    Heel Slides AAROM;Right;20 reps    Straight Leg Raises Right;2 sets;10 reps                       PT Short Term Goals - 10/01/21 1355       PT SHORT TERM GOAL #1   Title Independent with initial HEP    Time 4    Period Weeks    Status Achieved    Target Date 10/24/21      PT SHORT TERM GOAL #2   Title Rt knee AROM improved 0-100 for improved function    Baseline 11/22: flexion 112 deg    Time 4    Period Weeks    Status Partially Met    Target Date 10/24/21      PT SHORT TERM GOAL #3   Title -      PT SHORT TERM GOAL #4   Title -               PT Long Term Goals - 09/26/21 1228       PT LONG TERM GOAL #1   Title Independent with final HEP    Time 8    Period Weeks    Status New    Target Date 11/21/21      PT LONG TERM GOAL #2   Title Rt knee AROM improved 0-110 for improved function    Time 8    Period Weeks    Status New    Target Date 11/21/21      PT LONG TERM GOAL #3    Title Amb independently without significant deviations for improved function    Time 8    Period Weeks    Status New    Target Date 11/21/21      PT LONG TERM GOAL #4   Title Report pain < 2/10 for improved function    Time 8    Period Weeks    Status New    Target Date 11/21/21      PT LONG TERM GOAL #5   Title FOTO score improved to 61 for improved function    Time 8    Period Weeks    Status New    Target Date 11/21/21                   Plan - 10/01/21 1356     Clinical Impression Statement Pt has made excellent progress with his flexion AROM since initial eval.  He has met STG #1 and partially met STG #2.  Will continue to benefit from PT to maximize function.    Personal Factors and Comorbidities Comorbidity 3+;Past/Current Experience    Comorbidities OA, Lt TKA DM, HTN    Examination-Activity Limitations Locomotion Level;Transfers;Bend;Sit;Sleep;Squat;Stairs;Stand;Lift    Examination-Participation Restrictions Cleaning;Yard Work;Community Activity;Occupation    Stability/Clinical Decision Making Evolving/Moderate complexity    Rehab Potential Good    PT Frequency 2x / week    PT Duration 8 weeks    PT Treatment/Interventions ADLs/Self Care Home Management;Cryotherapy;Electrical Stimulation;Moist Heat;Ultrasound;DME Instruction;Balance training;Therapeutic exercise;Therapeutic activities;Functional mobility training;Stair training;Gait training;Neuromuscular re-education;Patient/family education;Manual techniques;Passive range of motion;Dry needling;Taping;Vasopneumatic Device    PT Next Visit Plan review HEP, maintain ROM, quad strengthening, balance/gait training    PT Home Exercise Plan Access Code: [facility]  Consulted and Agree with Plan of Care Patient             Patient will benefit from skilled therapeutic intervention in order to improve the following deficits and impairments:  Abnormal gait, Increased fascial restricitons, Pain, Decreased  mobility, Decreased range of motion, Decreased endurance, Decreased strength, Impaired flexibility, Difficulty walking, Decreased balance  Visit Diagnosis: Muscle weakness (generalized)  Acute pain of right knee  Stiffness of right knee, not elsewhere classified  Other abnormalities of gait and mobility  Localized edema     Problem List Patient Active Problem List   Diagnosis Date Noted   Total knee replacement status, right 09/06/2021   Unilateral primary osteoarthritis, right knee    Arthritis of left knee 02/27/2021   Unilateral primary osteoarthritis, left knee         Laureen Abrahams, PT, DPT 10/01/21 2:00 PM     [facility] [address] [place], [place], [address] Phone: [phone]   Fax:  [phone]  Name: SHAQUON GROPP MRN: [id number] Date of Birth: 1961-08-15

## 2021-10-01 NOTE — Telephone Encounter (Signed)
Patient is s/p a right THR 09/06/21 asking for refill on Oxycodone last refill was 09/23/21 #30 please advise.

## 2021-10-01 NOTE — Telephone Encounter (Signed)
Rx refill Oxycodone °

## 2021-10-02 ENCOUNTER — Encounter: Payer: Self-pay | Admitting: Orthopedic Surgery

## 2021-10-02 ENCOUNTER — Encounter: Payer: BC Managed Care – PPO | Admitting: Rehabilitative and Restorative Service Providers"

## 2021-10-02 ENCOUNTER — Ambulatory Visit (INDEPENDENT_AMBULATORY_CARE_PROVIDER_SITE_OTHER): Payer: BC Managed Care – PPO | Admitting: Orthopedic Surgery

## 2021-10-02 DIAGNOSIS — Z96651 Presence of right artificial knee joint: Secondary | ICD-10-CM

## 2021-10-02 NOTE — Progress Notes (Signed)
   Post-Op Visit Note   Patient: Travis White           Date of Birth: August 31, 1961           MRN: [id number] Visit Date: 10/02/2021 PCP: Jarome Matin, MD   Assessment & Plan:  Chief Complaint:  Chief Complaint  Patient presents with   Right Knee - Wound Check   Visit Diagnoses:  1. History of arthroplasty of right knee     Plan: Travis White is a 60 year old patient who underwent right total knee replacement 09/04/2021.  Saw primary care provider yesterday who felt like there was possible infection at the inferior aspect of the incision.  Doxycycline started.  The patient has not had any fevers or chills.  On exam the proximal part of the incision looks intact.  The bottom 3 to 4 cm has about a 2 to 3 cm rim of erythema around it consistent with early cellulitis but I cannot express any purulent discharge or fluctuance from this region.  The area is outlined with a black sharpie pen.  I think that based on the physician's experience yesterday in inspecting the wound that the antibiotics were appropriate.  No significant right knee effusion and it does not look like the knee itself is infected.  I think the patient does have some cellulitis at the inferior aspect of the incision which should improve on doxycycline.  No need for further intervention at this time.  Negative Homans no calf tenderness today.  No proximal lymphadenopathy..  Follow-up with Dr. Lajoyce Corners on Monday.  Doxy supply is for 10 days  Follow-Up Instructions: No follow-ups on file.   Orders:  No orders of the defined types were placed in this encounter.  No orders of the defined types were placed in this encounter.   Imaging: No results found.  PMFS History: Patient Active Problem List   Diagnosis Date Noted   Total knee replacement status, right 09/06/2021   Unilateral primary osteoarthritis, right knee    Arthritis of left knee 02/27/2021   Unilateral primary osteoarthritis, left knee    Past Medical History:   Diagnosis Date   Arthritis    Diabetes mellitus    type II   Hypertension     History reviewed. No pertinent family history.  Past Surgical History:  Procedure Laterality Date   Left elbow Left    TOTAL KNEE ARTHROPLASTY Left 02/27/2021   Procedure: LEFT TOTAL KNEE ARTHROPLASTY;  Surgeon: Nadara Mustard, MD;  Location: [facility] OR;  Service: Orthopedics;  Laterality: Left;   TOTAL KNEE ARTHROPLASTY Right 09/06/2021   Procedure: RIGHT TOTAL KNEE ARTHROPLASTY;  Surgeon: Nadara Mustard, MD;  Location: [facility] OR;  Service: Orthopedics;  Laterality: Right;   Social History   Occupational History   Not on file  Tobacco Use   Smoking status: Every Day    Packs/day: 1.00    Types: Cigarettes   Smokeless tobacco: Never  Vaping Use   Vaping Use: Never used  Substance and Sexual Activity   Alcohol use: No   Drug use: No   Sexual activity: Not on file

## 2021-10-07 ENCOUNTER — Ambulatory Visit (INDEPENDENT_AMBULATORY_CARE_PROVIDER_SITE_OTHER): Payer: BC Managed Care – PPO | Admitting: Orthopedic Surgery

## 2021-10-07 ENCOUNTER — Encounter: Payer: BC Managed Care – PPO | Admitting: Orthopedic Surgery

## 2021-10-07 ENCOUNTER — Encounter: Payer: Self-pay | Admitting: Orthopedic Surgery

## 2021-10-07 DIAGNOSIS — Z96651 Presence of right artificial knee joint: Secondary | ICD-10-CM

## 2021-10-07 MED ORDER — OXYCODONE-ACETAMINOPHEN 5-325 MG PO TABS: 1.0000 | ORAL_TABLET | ORAL | 0 refills | Status: DC | PRN

## 2021-10-07 NOTE — Progress Notes (Signed)
Office Visit Note   Patient: Travis White           Date of Birth: [birth year]           MRN: [id number] Visit Date: 10/07/2021              Requested by: Travis Matin, MD [address] [place],  [place] [address] PCP: Travis Matin, MD  Chief Complaint  Patient presents with   Right Knee - Routine Post Op    Wound check on incision      HPI: Patient is a 60 year old gentleman who is 4 weeks status post right total knee arthroplasty.  Patient did have some redness and swelling on the inferior aspect of the incision and Steri-Strips were applied he started iodine to the incision and was started on doxycycline by Dr. Jarold White.  Assessment & Plan: Visit Diagnoses:  1. Total knee replacement status, right     Plan: Recommended discontinuing the iodine start with Travis White or cocoa butter for scar massage complete his 10-day course of doxycycline continue with physical therapy.  Follow-Up Instructions: Return in about 4 weeks (around 11/04/2021).   Ortho Exam  Patient is alert, oriented, no adenopathy, well-dressed, normal affect, normal respiratory effort. Patient is ambulating with a cane he has range of motion from 0 to 115 degrees he states he has been quite active.  There is no redness or cellulitis he does have some venous stasis swelling.  There is no wound dehiscence.  No drainage.  Imaging: No results found. No images are attached to the encounter.  Labs: Lab Results  Component Value Date   HGBA1C 7.3 (H) 09/03/2021   HGBA1C 7.4 (H) 02/27/2021   HGBA1C (H) 12/12/2010    8.7 (NOTE)                                                                       According to the ADA Clinical Practice Recommendations for 2011, when HbA1c is used as a screening test:   >=6.5%   Diagnostic of Diabetes Mellitus           (if abnormal result  is confirmed)  5.7-6.4%   Increased risk of developing Diabetes Mellitus  References:Diagnosis and Classification of Diabetes  [place],Diabetes Care,2011,34(Suppl 1):S62-S69 and Standards of Medical Care in         Diabetes - 2011,Diabetes Care,2011,34  (Suppl 1):S11-S61.     No results found for: ALBUMIN, PREALBUMIN, CBC  No results found for: MG No results found for: VD25OH  No results found for: PREALBUMIN CBC EXTENDED Latest Ref Rng & Units 09/03/2021 02/25/2021 12/13/2010  WBC 4.0 - 10.5 K/uL 9.9 11.4(H) -  RBC 4.22 - 5.81 MIL/uL 5.24 5.12 -  HGB 13.0 - 17.0 g/dL 99.3 57.0 11.0(L)  HCT 39.0 - 52.0 % 46.2 45.6 33.2(L)  PLT 150 - 400 K/uL 249 249 -     There is no height or weight on file to calculate BMI.  Orders:  No orders of the defined types were placed in this encounter.  No orders of the defined types were placed in this encounter.    Procedures: No procedures performed  Clinical Data: No additional findings.  ROS:  All other systems negative, except as noted  in the HPI. Review of Systems  Objective: Vital Signs: There were no vitals taken for this visit.  Specialty Comments:  No specialty comments available.  PMFS History: Patient Active Problem List   Diagnosis Date Noted   Total knee replacement status, right 09/06/2021   Unilateral primary osteoarthritis, right knee    Arthritis of left knee 02/27/2021   Unilateral primary osteoarthritis, left knee    Past Medical History:  Diagnosis Date   Arthritis    Diabetes mellitus    type II   Hypertension     No family history on file.  Past Surgical History:  Procedure Laterality Date   Left elbow Left    TOTAL KNEE ARTHROPLASTY Left 02/27/2021   Procedure: LEFT TOTAL KNEE ARTHROPLASTY;  Surgeon: Travis Mustard, MD;  Location: [facility] OR;  Service: Orthopedics;  Laterality: Left;   TOTAL KNEE ARTHROPLASTY Right 09/06/2021   Procedure: RIGHT TOTAL KNEE ARTHROPLASTY;  Surgeon: Travis Mustard, MD;  Location: [facility] OR;  Service: Orthopedics;  Laterality: Right;   Social History   Occupational History   Not on file  Tobacco Use    Smoking status: Every Day    Packs/day: 1.00    Types: Cigarettes   Smokeless tobacco: Never  Vaping Use   Vaping Use: Never used  Substance and Sexual Activity   Alcohol use: No   Drug use: No   Sexual activity: Not on file

## 2021-10-08 ENCOUNTER — Encounter: Payer: Self-pay | Admitting: Physical Therapy

## 2021-10-08 ENCOUNTER — Ambulatory Visit (INDEPENDENT_AMBULATORY_CARE_PROVIDER_SITE_OTHER): Payer: BC Managed Care – PPO | Admitting: Physical Therapy

## 2021-10-08 ENCOUNTER — Other Ambulatory Visit: Payer: Self-pay

## 2021-10-08 DIAGNOSIS — R2689 Other abnormalities of gait and mobility: Secondary | ICD-10-CM | POA: Diagnosis not present

## 2021-10-08 DIAGNOSIS — R600 Localized edema: Secondary | ICD-10-CM

## 2021-10-08 DIAGNOSIS — M6281 Muscle weakness (generalized): Secondary | ICD-10-CM

## 2021-10-08 DIAGNOSIS — M25562 Pain in left knee: Secondary | ICD-10-CM

## 2021-10-08 DIAGNOSIS — M25662 Stiffness of left knee, not elsewhere classified: Secondary | ICD-10-CM

## 2021-10-08 DIAGNOSIS — M25661 Stiffness of right knee, not elsewhere classified: Secondary | ICD-10-CM

## 2021-10-08 DIAGNOSIS — M25561 Pain in right knee: Secondary | ICD-10-CM

## 2021-10-08 NOTE — Therapy (Addendum)
[facility] [address] [place], [place], [address] Phone: [phone]   Fax:  [phone]  Physical Therapy Treatment  Patient Details  Name: Travis White MRN: [id number] Date of Birth: Jul 08, 1961 Referring Provider (PT): Suzan Slick, NP   Encounter Date: 10/08/2021   PT End of Session - 10/08/21 1434     Visit Number 3    Number of Visits 16    Date for PT Re-Evaluation 11/21/21    Authorization Type BCBS    PT Start Time 1433    PT Stop Time 1515    PT Time Calculation (min) 42 min    Activity Tolerance Patient tolerated treatment well    Behavior During Therapy [facility] for tasks assessed/performed             Past Medical History:  Diagnosis Date   Arthritis    Diabetes mellitus    type II   Hypertension     Past Surgical History:  Procedure Laterality Date   Left elbow Left    TOTAL KNEE ARTHROPLASTY Left 02/27/2021   Procedure: LEFT TOTAL KNEE ARTHROPLASTY;  Surgeon: Newt Minion, MD;  Location: [place];  Service: Orthopedics;  Laterality: Left;   TOTAL KNEE ARTHROPLASTY Right 09/06/2021   Procedure: RIGHT TOTAL KNEE ARTHROPLASTY;  Surgeon: Newt Minion, MD;  Location: [place];  Service: Orthopedics;  Laterality: Right;    There were no vitals filed for this visit.   Subjective Assessment - 10/08/21 1435     Subjective Patien had an infection in his knee. AB are working. He saw Dr. Sharol Given yesterday and he is happy with status.    Pertinent History OA, Lt TKA DM, HTN    Limitations Standing;Walking    Patient Stated Goals improve mobility, return to work    Currently in Pain? Yes    Pain Score 5     Pain Location Knee    Pain Orientation Right    Pain Descriptors / Indicators Aching                [facility] PT Assessment - 10/08/21 0001       AROM   Right Knee Flexion 114                           [facility] Adult PT Treatment/Exercise - 10/08/21 0001       Ambulation/Gait    Ambulation/Gait Yes    Ambulation/Gait Assistance 6: Modified independent (Device/Increase time)    Ambulation Distance (Feet) 100 Feet    Assistive device None;Straight cane    Gait Pattern Step-through pattern;Decreased stance time - left;Decreased stride length;Lateral trunk lean to left    Ambulation Surface Level    Gait Comments worked on exaggerated step length to help normalize stance time bil, also adjusted cane to correct height      Knee/Hip Exercises: Stretches   Other Knee/Hip Stretches rockerboard stretch for gastroc/soleus x 1 min      Knee/Hip Exercises: Aerobic   Recumbent Bike partial revolutions x 8 min; seat 2      Knee/Hip Exercises: Machines for Strengthening   Cybex Knee Extension 10# Rt leg x 20; bil x 10    Cybex Knee Flexion 25# 3x 10    Total Gym Leg Press bil push 81# 3x10; RLE only 50# 3x10      Knee/Hip Exercises: Standing   Lateral Step Up Right;2 sets;10 reps;Hand Hold: 1;Step Height: 6"  Forward Step Up Right;2 sets;10 reps;Hand Hold: 1;Step Height: 6"    Step Down 1 set;10 reps;Left;Step Height: 4";Hand Hold: 2    Step Down Limitations heel taps also: 2 and 4 inch x 10 each standing on LLE    Other Standing Knee Exercises on foam; standing balance with head turns and flex/ext; pt demos good balance      Knee/Hip Exercises: Seated   Long Arc Quad Right;3 sets;10 reps;Weights    Long Arc Quad Weight 5 lbs.                       PT Short Term Goals - 10/01/21 1355       PT SHORT TERM GOAL #1   Title Independent with initial HEP    Time 4    Period Weeks    Status Achieved    Target Date 10/24/21      PT SHORT TERM GOAL #2   Title Rt knee AROM improved 0-100 for improved function    Baseline 11/22: flexion 112 deg    Time 4    Period Weeks    Status Partially Met    Target Date 10/24/21      PT SHORT TERM GOAL #3   Title -      PT SHORT TERM GOAL #4   Title -               PT Long Term Goals - 09/26/21 1228        PT LONG TERM GOAL #1   Title Independent with final HEP    Time 8    Period Weeks    Status New    Target Date 11/21/21      PT LONG TERM GOAL #2   Title Rt knee AROM improved 0-110 for improved function    Time 8    Period Weeks    Status New    Target Date 11/21/21      PT LONG TERM GOAL #3   Title Amb independently without significant deviations for improved function    Time 8    Period Weeks    Status New    Target Date 11/21/21      PT LONG TERM GOAL #4   Title Report pain < 2/10 for improved function    Time 8    Period Weeks    Status New    Target Date 11/21/21      PT LONG TERM GOAL #5   Title FOTO score improved to 61 for improved function    Time 8    Period Weeks    Status New    Target Date 11/21/21                   Plan - 10/08/21 1519     Clinical Impression Statement Patient did well today with new TE. Able to progress to machines for knee flex/ext. AROM to 114 deg today. Gait normalized after working on exaggerated step length,but pt returns to trunk lean when not verbally cued. LTGs are ongoing.    Comorbidities OA, Lt TKA DM, HTN    PT Frequency 2x / week    PT Duration 8 weeks    PT Treatment/Interventions ADLs/Self Care Home Management;Cryotherapy;Electrical Stimulation;Moist Heat;Ultrasound;DME Instruction;Balance training;Therapeutic exercise;Therapeutic activities;Functional mobility training;Stair training;Gait training;Neuromuscular re-education;Patient/family education;Manual techniques;Passive range of motion;Dry needling;Taping;Vasopneumatic Device    PT Next Visit Plan maintain ROM, quad strengthening, balance/gait training    PT Home Exercise  Plan Access Code: [email or web address]    [email or web address] and Agree with Plan of Care Patient             Patient will benefit from skilled therapeutic intervention in order to improve the following deficits and impairments:  Abnormal gait, Increased fascial restricitons, Pain, Decreased  mobility, Decreased range of motion, Decreased endurance, Decreased strength, Impaired flexibility, Difficulty walking, Decreased balance  Visit Diagnosis: Muscle weakness (generalized)  Acute pain of right knee  Stiffness of right knee, not elsewhere classified  Other abnormalities of gait and mobility  Localized edema     Problem List Patient Active Problem List   Diagnosis Date Noted   Total knee replacement status, right 09/06/2021   Unilateral primary osteoarthritis, right knee    Arthritis of left knee 02/27/2021   Unilateral primary osteoarthritis, left knee     Madelyn Flavors, PT 10/08/2021, 3:26 PM  Laureen Abrahams, PT, DPT 10/15/21 3:08 PM   [facility] [address] [place], [place], [address] Phone: [phone]   Fax:  [phone]  Name: Travis White MRN: [id number] Date of Birth: [birth year]

## 2021-10-10 ENCOUNTER — Other Ambulatory Visit: Payer: Self-pay

## 2021-10-10 ENCOUNTER — Encounter: Payer: Self-pay | Admitting: Physical Therapy

## 2021-10-10 ENCOUNTER — Ambulatory Visit (INDEPENDENT_AMBULATORY_CARE_PROVIDER_SITE_OTHER): Payer: BC Managed Care – PPO | Admitting: Physical Therapy

## 2021-10-10 DIAGNOSIS — R600 Localized edema: Secondary | ICD-10-CM

## 2021-10-10 DIAGNOSIS — M25662 Stiffness of left knee, not elsewhere classified: Secondary | ICD-10-CM

## 2021-10-10 DIAGNOSIS — M6281 Muscle weakness (generalized): Secondary | ICD-10-CM

## 2021-10-10 DIAGNOSIS — M25562 Pain in left knee: Secondary | ICD-10-CM

## 2021-10-10 DIAGNOSIS — R2689 Other abnormalities of gait and mobility: Secondary | ICD-10-CM

## 2021-10-10 DIAGNOSIS — M25661 Stiffness of right knee, not elsewhere classified: Secondary | ICD-10-CM

## 2021-10-10 DIAGNOSIS — M25561 Pain in right knee: Secondary | ICD-10-CM

## 2021-10-10 NOTE — Therapy (Addendum)
[facility] [address] [place], [place], [address] Phone: [phone]   Fax:  [phone]  Physical Therapy Treatment  Patient Details  Name: Travis White MRN: [id number] Date of Birth: August 18, 1961 Referring Provider (PT): Suzan Slick, NP   Encounter Date: 10/10/2021   PT End of Session - 10/10/21 1434     Visit Number 4    Number of Visits 16    Date for PT Re-Evaluation 11/21/21    Authorization Type BCBS    PT Start Time 1434    PT Stop Time 1515    PT Time Calculation (min) 41 min    Activity Tolerance Patient tolerated treatment well    Behavior During Therapy [facility] for tasks assessed/performed             Past Medical History:  Diagnosis Date   Arthritis    Diabetes mellitus    type II   Hypertension     Past Surgical History:  Procedure Laterality Date   Left elbow Left    TOTAL KNEE ARTHROPLASTY Left 02/27/2021   Procedure: LEFT TOTAL KNEE ARTHROPLASTY;  Surgeon: Newt Minion, MD;  Location: [place];  Service: Orthopedics;  Laterality: Left;   TOTAL KNEE ARTHROPLASTY Right 09/06/2021   Procedure: RIGHT TOTAL KNEE ARTHROPLASTY;  Surgeon: Newt Minion, MD;  Location: [place];  Service: Orthopedics;  Laterality: Right;    There were no vitals filed for this visit.   Subjective Assessment - 10/10/21 1437     Subjective Patient has been working on gait at home.    Pertinent History OA, Lt TKA DM, HTN    Limitations Standing;Walking    Patient Stated Goals improve mobility, return to work    Currently in Pain? Yes    Pain Score 3     Pain Location Knee    Pain Orientation Right    Pain Descriptors / Indicators Aching                               [facility] Adult PT Treatment/Exercise - 10/10/21 0001       Ambulation/Gait   Gait Comments gait x 27f without AD      Knee/Hip Exercises: Aerobic   Recumbent Bike partial revolutions x 8 min; seat 2      Knee/Hip Exercises: Machines for  Strengthening   Cybex Knee Extension 10# bil up/ RLE down x 15; then bil x 15    Cybex Knee Flexion RLE 20# 3x10    Total Gym Leg Press bil 100# 3x10; RLE only 62# 3x10      Knee/Hip Exercises: Standing   Terminal Knee Extension Right;15 reps    Theraband Level (Terminal Knee Extension) Level 4 (Blue)    Terminal Knee Extension Limitations 10 sec hold    Hip Abduction Right;Left;10 reps    Abduction Limitations on bosu with BUE support    Forward Step Up 10 reps;Both    Forward Step Up Limitations onto bosu one UE support      Knee/Hip Exercises: Seated   Sit to Sand without UE support;10 reps;15 reps   2nd set with foam under LLE to shift wt Rt.                      PT Short Term Goals - 10/01/21 1355       PT SHORT TERM GOAL #1   Title Independent with initial HEP  Time 4    Period Weeks    Status Achieved    Target Date 10/24/21      PT SHORT TERM GOAL #2   Title Rt knee AROM improved 0-100 for improved function    Baseline 11/22: flexion 112 deg    Time 4    Period Weeks    Status Partially Met    Target Date 10/24/21      PT SHORT TERM GOAL #3   Title -      PT SHORT TERM GOAL #4   Title -               PT Long Term Goals - 09/26/21 1228       PT LONG TERM GOAL #1   Title Independent with final HEP    Time 8    Period Weeks    Status New    Target Date 11/21/21      PT LONG TERM GOAL #2   Title Rt knee AROM improved 0-110 for improved function    Time 8    Period Weeks    Status New    Target Date 11/21/21      PT LONG TERM GOAL #3   Title Amb independently without significant deviations for improved function    Time 8    Period Weeks    Status New    Target Date 11/21/21      PT LONG TERM GOAL #4   Title Report pain < 2/10 for improved function    Time 8    Period Weeks    Status New    Target Date 11/21/21      PT LONG TERM GOAL #5   Title FOTO score improved to 61 for improved function    Time 8    Period Weeks     Status New    Target Date 11/21/21                   Plan - 10/10/21 1518     Clinical Impression Statement Patient tolerated increased strengthening without complaint. Visible shaking with TKE with band which was done after machines. Gait improved today without cane. Advised patient he can amb without cane at home. Still advised bringing in community for safety as needed.    PT Treatment/Interventions ADLs/Self Care Home Management;Cryotherapy;Electrical Stimulation;Moist Heat;Ultrasound;DME Instruction;Balance training;Therapeutic exercise;Therapeutic activities;Functional mobility training;Stair training;Gait training;Neuromuscular re-education;Patient/family education;Manual techniques;Passive range of motion;Dry needling;Taping;Vasopneumatic Device    PT Next Visit Plan maintain ROM, quad strengthening, progress balance training             Patient will benefit from skilled therapeutic intervention in order to improve the following deficits and impairments:  Abnormal gait, Increased fascial restricitons, Pain, Decreased mobility, Decreased range of motion, Decreased endurance, Decreased strength, Impaired flexibility, Difficulty walking, Decreased balance  Visit Diagnosis: Muscle weakness (generalized)  Acute pain of right knee  Stiffness of right knee, not elsewhere classified  Other abnormalities of gait and mobility  Localized edema     Problem List Patient Active Problem List   Diagnosis Date Noted   Total knee replacement status, right 09/06/2021   Unilateral primary osteoarthritis, right knee    Arthritis of left knee 02/27/2021   Unilateral primary osteoarthritis, left knee    Madelyn Flavors, PT 10/10/2021, 3:23 PM  Laureen Abrahams, PT, DPT 10/15/21 3:08 PM     [facility] [address] [place], [place], [address] Phone: [phone]   Fax:  [phone]  Name: Travis White MRN: [id number] Date of  Birth: 1961/02/23

## 2021-10-15 ENCOUNTER — Other Ambulatory Visit: Payer: Self-pay | Admitting: Orthopedic Surgery

## 2021-10-15 ENCOUNTER — Telehealth: Payer: Self-pay | Admitting: Orthopedic Surgery

## 2021-10-15 ENCOUNTER — Other Ambulatory Visit: Payer: Self-pay

## 2021-10-15 ENCOUNTER — Ambulatory Visit (INDEPENDENT_AMBULATORY_CARE_PROVIDER_SITE_OTHER): Payer: BC Managed Care – PPO | Admitting: Rehabilitative and Restorative Service Providers"

## 2021-10-15 ENCOUNTER — Encounter: Payer: Self-pay | Admitting: Rehabilitative and Restorative Service Providers"

## 2021-10-15 DIAGNOSIS — R2689 Other abnormalities of gait and mobility: Secondary | ICD-10-CM | POA: Diagnosis not present

## 2021-10-15 DIAGNOSIS — M25561 Pain in right knee: Secondary | ICD-10-CM

## 2021-10-15 DIAGNOSIS — M25661 Stiffness of right knee, not elsewhere classified: Secondary | ICD-10-CM

## 2021-10-15 DIAGNOSIS — R600 Localized edema: Secondary | ICD-10-CM

## 2021-10-15 DIAGNOSIS — M6281 Muscle weakness (generalized): Secondary | ICD-10-CM

## 2021-10-15 MED ORDER — OXYCODONE-ACETAMINOPHEN 5-325 MG PO TABS: 1.0000 | ORAL_TABLET | ORAL | 0 refills | Status: AC | PRN

## 2021-10-15 NOTE — Telephone Encounter (Signed)
Pt came in and asked to have something called in for pain. He would like a CB to update him what's been sent in and when.   [phone]

## 2021-10-15 NOTE — Therapy (Signed)
[facility] [address] [place], [place], [address] Phone: [phone]   Fax:  [phone]  Physical Therapy Treatment  Patient Details  Name: Travis White MRN: [id number] Date of Birth: [birth year] Referring Provider (PT): Suzan Slick, NP   Encounter Date: 10/15/2021   PT End of Session - 10/15/21 1305     Visit Number 5    Number of Visits 16    Date for PT Re-Evaluation 11/21/21    Authorization Type BCBS    PT Start Time 1259    PT Stop Time 1338    PT Time Calculation (min) 39 min    Activity Tolerance Patient tolerated treatment well    Behavior During Therapy [facility] for tasks assessed/performed             Past Medical History:  Diagnosis Date   Arthritis    Diabetes mellitus    type II   Hypertension     Past Surgical History:  Procedure Laterality Date   Left elbow Left    TOTAL KNEE ARTHROPLASTY Left 02/27/2021   Procedure: LEFT TOTAL KNEE ARTHROPLASTY;  Surgeon: Newt Minion, MD;  Location: [place];  Service: Orthopedics;  Laterality: Left;   TOTAL KNEE ARTHROPLASTY Right 09/06/2021   Procedure: RIGHT TOTAL KNEE ARTHROPLASTY;  Surgeon: Newt Minion, MD;  Location: [place];  Service: Orthopedics;  Laterality: Right;    There were no vitals filed for this visit.   Subjective Assessment - 10/15/21 1302     Subjective Pt. indicated having an infection in knee but took medicine and its gotten better.  Pt. indicated a little pain upon arrival today but noticed most pain after leg was off bed when sleeping.    Pertinent History OA, Lt TKA DM, HTN    Limitations Standing;Walking    Patient Stated Goals improve mobility, return to work    Pain Score 3     Pain Location Knee   to shin   Pain Orientation Right    Pain Descriptors / Indicators Aching;Tightness    Pain Type Surgical pain    Pain Onset More than a month ago    Pain Frequency Intermittent    Aggravating Factors  lying with leg off bed when sleeping.     Pain Relieving Factors moving helps stiffness.                               [facility] PT Treatment/Exercise - 10/15/21 0001       Ambulation/Gait   Gait Comments Able to perform independent ambulation in clinic      Neuro Re-ed    Neuro Re-ed Details  fwd/rev ambulation c big steps 10 ft x 6 each way in // bars, tandem stance on foam 1 min x 1 bilateral, fwd/lateral/posterior slider single leg stance focus x 10 each way Rt leg WB      Knee/Hip Exercises: Stretches   Gastroc Stretch 30 seconds;5 reps;Right   Runner stretch incline board Rt LE posterior     Knee/Hip Exercises: Aerobic   Recumbent Bike partial revolutions x 6 min; seat 1      Knee/Hip Exercises: Machines for Strengthening   Cybex Knee Extension Double leg up, Rt lowering only 3 x 10 10 lbs    Total Gym Leg Press Rt leg 62 lbs 3 x 10      Knee/Hip Exercises: Standing   Forward Step Up Step Height: 6";Right;2  sets;15 reps   blue band TKE on top of step     Knee/Hip Exercises: Seated   Knee/Hip Flexion seated SLR Rt 2 x 10    Sit to Sand without UE support;10 reps;15 reps                       PT Short Term Goals - 10/01/21 1355       PT SHORT TERM GOAL #1   Title Independent with initial HEP    Time 4    Period Weeks    Status Achieved    Target Date 10/24/21      PT SHORT TERM GOAL #2   Title Rt knee AROM improved 0-100 for improved function    Baseline 11/22: flexion 112 deg    Time 4    Period Weeks    Status Partially Met    Target Date 10/24/21      PT SHORT TERM GOAL #3   Title -      PT SHORT TERM GOAL #4   Title -               PT Long Term Goals - 09/26/21 1228       PT LONG TERM GOAL #1   Title Independent with final HEP    Time 8    Period Weeks    Status New    Target Date 11/21/21      PT LONG TERM GOAL #2   Title Rt knee AROM improved 0-110 for improved function    Time 8    Period Weeks    Status New    Target Date 11/21/21       PT LONG TERM GOAL #3   Title Amb independently without significant deviations for improved function    Time 8    Period Weeks    Status New    Target Date 11/21/21      PT LONG TERM GOAL #4   Title Report pain < 2/10 for improved function    Time 8    Period Weeks    Status New    Target Date 11/21/21      PT LONG TERM GOAL #5   Title FOTO score improved to 61 for improved function    Time 8    Period Weeks    Status New    Target Date 11/21/21                   Plan - 10/15/21 1321     Clinical Impression Statement Able to ambulate in clinic on level surfaces independently c good control.  Will continue to benefit from skilled PT services for dynamic stability and overall strength improvements related to Rt leg.    Personal Factors and Comorbidities Comorbidity 3+;Past/Current Experience    Comorbidities OA, Lt TKA DM, HTN    Examination-Activity Limitations Locomotion Level;Transfers;Bend;Sit;Sleep;Squat;Stairs;Stand;Lift    Examination-Participation Restrictions Cleaning;Yard Work;Community Activity;Occupation    Rehab Potential Good    PT Frequency 2x / week    PT Duration 8 weeks    PT Treatment/Interventions ADLs/Self Care Home Management;Cryotherapy;Electrical Stimulation;Moist Heat;Ultrasound;DME Instruction;Balance training;Therapeutic exercise;Therapeutic activities;Functional mobility training;Stair training;Gait training;Neuromuscular re-education;Patient/family education;Manual techniques;Passive range of motion;Dry needling;Taping;Vasopneumatic Device    PT Next Visit Plan Progressive strengtheing, dynamic and compliant balance.    PT Home Exercise Plan Access Code: [email or web address]    Consulted and Agree with Plan of Care Patient  Patient will benefit from skilled therapeutic intervention in order to improve the following deficits and impairments:  Abnormal gait, Increased fascial restricitons, Pain, Decreased mobility, Decreased range of  motion, Decreased endurance, Decreased strength, Impaired flexibility, Difficulty walking, Decreased balance  Visit Diagnosis: Muscle weakness (generalized)  Acute pain of right knee  Stiffness of right knee, not elsewhere classified  Other abnormalities of gait and mobility  Localized edema     Problem List Patient Active Problem List   Diagnosis Date Noted   Total knee replacement status, right 09/06/2021   Unilateral primary osteoarthritis, right knee    Arthritis of left knee 02/27/2021   Unilateral primary osteoarthritis, left knee     Scot Jun, PT, DPT, OCS, ATC 10/15/21  1:34 PM    [facility] [address] [place], [place], [address] Phone: [phone]   Fax:  [phone]  Name: CIRE CLUTE MRN: [id number] Date of Birth: 03/18/1961

## 2021-10-15 NOTE — Telephone Encounter (Signed)
S/p total knee replacement 09/06/21 last refill of Oxycodone was 10/07/21 #30 pt asking for refill please advise.

## 2021-10-15 NOTE — Addendum Note (Signed)
Addended by: Moshe Cipro F on: 10/15/2021 03:03 PM   Modules accepted: Orders

## 2021-10-16 NOTE — Telephone Encounter (Signed)
Pt informed

## 2021-10-16 NOTE — Telephone Encounter (Signed)
Can you please call to advise of message below? thanks

## 2021-10-17 ENCOUNTER — Encounter: Payer: BC Managed Care – PPO | Admitting: Physical Therapy

## 2021-10-17 ENCOUNTER — Ambulatory Visit: Payer: BC Managed Care – PPO | Admitting: Orthopedic Surgery

## 2021-10-22 ENCOUNTER — Ambulatory Visit (INDEPENDENT_AMBULATORY_CARE_PROVIDER_SITE_OTHER): Payer: BC Managed Care – PPO | Admitting: Rehabilitative and Restorative Service Providers"

## 2021-10-22 ENCOUNTER — Encounter: Payer: Self-pay | Admitting: Rehabilitative and Restorative Service Providers"

## 2021-10-22 ENCOUNTER — Other Ambulatory Visit: Payer: Self-pay

## 2021-10-22 DIAGNOSIS — R2689 Other abnormalities of gait and mobility: Secondary | ICD-10-CM | POA: Diagnosis not present

## 2021-10-22 DIAGNOSIS — M25661 Stiffness of right knee, not elsewhere classified: Secondary | ICD-10-CM

## 2021-10-22 DIAGNOSIS — M25561 Pain in right knee: Secondary | ICD-10-CM | POA: Diagnosis not present

## 2021-10-22 DIAGNOSIS — M6281 Muscle weakness (generalized): Secondary | ICD-10-CM

## 2021-10-22 DIAGNOSIS — R600 Localized edema: Secondary | ICD-10-CM

## 2021-10-22 NOTE — Therapy (Signed)
[facility] [address] [place], [place], [address] Phone: [phone]   Fax:  [phone]  Physical Therapy Treatment  Patient Details  Name: Travis White MRN: [id number] Date of Birth: [birth year] Referring Provider (PT): Suzan Slick, NP   Encounter Date: 10/22/2021   PT End of Session - 10/22/21 1434     Visit Number 6    Number of Visits 16    Date for PT Re-Evaluation 11/21/21    Authorization Type BCBS    PT Start Time 1430    PT Stop Time 1509    PT Time Calculation (min) 39 min    Activity Tolerance Patient tolerated treatment well    Behavior During Therapy [facility] for tasks assessed/performed             Past Medical History:  Diagnosis Date   Arthritis    Diabetes mellitus    type II   Hypertension     Past Surgical History:  Procedure Laterality Date   Left elbow Left    TOTAL KNEE ARTHROPLASTY Left 02/27/2021   Procedure: LEFT TOTAL KNEE ARTHROPLASTY;  Surgeon: Newt Minion, MD;  Location: [place];  Service: Orthopedics;  Laterality: Left;   TOTAL KNEE ARTHROPLASTY Right 09/06/2021   Procedure: RIGHT TOTAL KNEE ARTHROPLASTY;  Surgeon: Newt Minion, MD;  Location: [place];  Service: Orthopedics;  Laterality: Right;    There were no vitals filed for this visit.   Subjective Assessment - 10/22/21 1433     Subjective Pt. indicated pain increased today after instance this morning in [employer] where a kid doing things ended up with him stepping and leg going out to side and getting irritated.    Pertinent History OA, Lt TKA DM, HTN    Limitations Standing;Walking    Patient Stated Goals improve mobility, return to work    Currently in Pain? Yes    Pain Score 8     Pain Location Knee    Pain Orientation Right    Pain Descriptors / Indicators Aching;Tightness;Sore    Pain Type Surgical pain    Pain Onset More than a month ago    Pain Frequency Intermittent    Aggravating Factors  incident of twisting/movement  this morning    Pain Relieving Factors nothing specific today.                [facility] PT Assessment - 10/22/21 0001       Assessment   Medical Diagnosis Z96.651 (ICD-10-CM) - History of arthroplasty of right knee    Referring Provider (PT) Suzan Slick, NP    Onset Date/Surgical Date 09/06/21    Hand Dominance Right      Balance   Balance Assessed Yes      Static Standing Balance   Static Standing - Balance Support No upper extremity supported    Static Standing - Level of Assistance 7: Independent    Static Standing Balance -  Activities  Tandam Stance - Left Leg;Tandam Stance - Right Leg   on foam   Static Standing - Comment/# of Minutes 1 minute each                           [facility] Adult PT Treatment/Exercise - 10/22/21 0001       Neuro Re-ed    Neuro Re-ed Details  tandem stance on foam 1 min x 1 bilateral, single leg stance 30 sec x 4 bilateral c  finger tip assist on bar      Knee/Hip Exercises: Stretches   Gastroc Stretch 30 seconds;5 reps   runner stretch on incilne board Rt leg posterior     Knee/Hip Exercises: Aerobic   Recumbent Bike partial revolutions x 6 min; seat 1      Knee/Hip Exercises: Machines for Strengthening   Cybex Knee Extension Double leg up, Rt lowering only 3 x 10 10 lbs    Total Gym Leg Press Rt leg 75 lbs 3 x 10      Knee/Hip Exercises: Supine   Other Supine Knee/Hip Exercises supine heel slide Rt 2 x 10, supine LAQ in 90 deg hip flexion 2 x 10 Rt                       PT Short Term Goals - 10/01/21 1355       PT SHORT TERM GOAL #1   Title Independent with initial HEP    Time 4    Period Weeks    Status Achieved    Target Date 10/24/21      PT SHORT TERM GOAL #2   Title Rt knee AROM improved 0-100 for improved function    Baseline 11/22: flexion 112 deg    Time 4    Period Weeks    Status Partially Met    Target Date 10/24/21      PT SHORT TERM GOAL #3   Title -      PT SHORT TERM GOAL #4    Title -               PT Long Term Goals - 09/26/21 1228       PT LONG TERM GOAL #1   Title Independent with final HEP    Time 8    Period Weeks    Status New    Target Date 11/21/21      PT LONG TERM GOAL #2   Title Rt knee AROM improved 0-110 for improved function    Time 8    Period Weeks    Status New    Target Date 11/21/21      PT LONG TERM GOAL #3   Title Amb independently without significant deviations for improved function    Time 8    Period Weeks    Status New    Target Date 11/21/21      PT LONG TERM GOAL #4   Title Report pain < 2/10 for improved function    Time 8    Period Weeks    Status New    Target Date 11/21/21      PT LONG TERM GOAL #5   Title FOTO score improved to 61 for improved function    Time 8    Period Weeks    Status New    Target Date 11/21/21                   Plan - 10/22/21 1457     Clinical Impression Statement Pt. was able to complete similar intervention today despite complaints from earlier in the day incident that resulted in increased symptoms.  Visible worsening of antalgic gait with and without cane today due to symptoms.    Personal Factors and Comorbidities Comorbidity 3+;Past/Current Experience    Comorbidities OA, Lt TKA DM, HTN    Examination-Activity Limitations Locomotion Level;Transfers;Bend;Sit;Sleep;Squat;Stairs;Stand;Lift    Examination-Participation Restrictions Cleaning;Yard Work;Community Activity;Occupation    Rehab Potential Good  PT Frequency 2x / week    PT Duration 8 weeks    PT Treatment/Interventions ADLs/Self Care Home Management;Cryotherapy;Electrical Stimulation;Moist Heat;Ultrasound;DME Instruction;Balance training;Therapeutic exercise;Therapeutic activities;Functional mobility training;Stair training;Gait training;Neuromuscular re-education;Patient/family education;Manual techniques;Passive range of motion;Dry needling;Taping;Vasopneumatic Device    PT Next Visit Plan  Progressive strengtheing, dynamic and compliant balance as tolerated.. Follow up about symptoms.    PT Home Exercise Plan Access Code: [email or web address]    [email or web address] and Agree with Plan of Care Patient             Patient will benefit from skilled therapeutic intervention in order to improve the following deficits and impairments:  Abnormal gait, Increased fascial restricitons, Pain, Decreased mobility, Decreased range of motion, Decreased endurance, Decreased strength, Impaired flexibility, Difficulty walking, Decreased balance  Visit Diagnosis: Acute pain of right knee  Stiffness of right knee, not elsewhere classified  Muscle weakness (generalized)  Other abnormalities of gait and mobility  Localized edema     Problem List Patient Active Problem List   Diagnosis Date Noted   Total knee replacement status, right 09/06/2021   Unilateral primary osteoarthritis, right knee    Arthritis of left knee 02/27/2021   Unilateral primary osteoarthritis, left knee     Scot Jun, PT, DPT, OCS, ATC 10/22/21  3:04 PM    [facility] [address] [place], [place], [address] Phone: [phone]   Fax:  [phone]  Name: LEAMAN ABE MRN: [id number] Date of Birth: 17-Jul-1961

## 2021-10-24 ENCOUNTER — Encounter: Payer: BC Managed Care – PPO | Admitting: Rehabilitative and Restorative Service Providers"

## 2021-10-24 ENCOUNTER — Telehealth: Payer: Self-pay | Admitting: Rehabilitative and Restorative Service Providers"

## 2021-10-24 NOTE — Telephone Encounter (Signed)
Called patient about today's scheduled visit and Pt reminded clinic about having to cancel due to family member transportation responsibility.  Clinician remembered it being discussed last visit.

## 2021-10-25 ENCOUNTER — Telehealth: Payer: Self-pay | Admitting: Orthopedic Surgery

## 2021-10-25 NOTE — Telephone Encounter (Signed)
I spoke with pt, he is completely out of his oxycodone from 10/15/21, did Christmas shopping and slipped on something on floor, rolled knee. He doesn't understand why he cannot get a refill, he has been getting refills done every week since surgery. Total knee replacement on 09/06/21. Looked up narcotic report, has gotten #120 pills over last 30 days.

## 2021-10-25 NOTE — Telephone Encounter (Signed)
Patient called needing Rx refilled Oxycodone. The number to contact patient is [phone]

## 2021-10-25 NOTE — Telephone Encounter (Signed)
It is too soon to refill this med. Last filled on 10/15/21, cannot fill until 30 days from refill. He was also told per last telephone note per Dr. Lajoyce Corners, needs to start weaning off pain meds.

## 2021-10-29 ENCOUNTER — Ambulatory Visit (INDEPENDENT_AMBULATORY_CARE_PROVIDER_SITE_OTHER): Payer: BC Managed Care – PPO | Admitting: Physical Therapy

## 2021-10-29 ENCOUNTER — Other Ambulatory Visit: Payer: Self-pay

## 2021-10-29 DIAGNOSIS — M6281 Muscle weakness (generalized): Secondary | ICD-10-CM

## 2021-10-29 DIAGNOSIS — M25661 Stiffness of right knee, not elsewhere classified: Secondary | ICD-10-CM

## 2021-10-29 DIAGNOSIS — M25561 Pain in right knee: Secondary | ICD-10-CM | POA: Diagnosis not present

## 2021-10-29 DIAGNOSIS — R600 Localized edema: Secondary | ICD-10-CM

## 2021-10-29 DIAGNOSIS — M25662 Stiffness of left knee, not elsewhere classified: Secondary | ICD-10-CM

## 2021-10-29 DIAGNOSIS — M25562 Pain in left knee: Secondary | ICD-10-CM

## 2021-10-29 DIAGNOSIS — R2689 Other abnormalities of gait and mobility: Secondary | ICD-10-CM | POA: Diagnosis not present

## 2021-10-29 NOTE — Therapy (Signed)
[facility] [address] [place], [place], [address] Phone: [phone]   Fax:  [phone]  Physical Therapy Treatment  Patient Details  Name: Travis White MRN: [id number] Date of Birth: 1960/12/18 Referring Provider (PT): Suzan Slick, NP   Encounter Date: 10/29/2021   PT End of Session - 10/29/21 1036     Visit Number 7    Number of Visits 16    Date for PT Re-Evaluation 11/21/21    Authorization Type BCBS    PT Start Time 1018    PT Stop Time 1058    PT Time Calculation (min) 40 min    Activity Tolerance Patient tolerated treatment well    Behavior During Therapy [facility] for tasks assessed/performed             Past Medical History:  Diagnosis Date   Arthritis    Diabetes mellitus    type II   Hypertension     Past Surgical History:  Procedure Laterality Date   Left elbow Left    TOTAL KNEE ARTHROPLASTY Left 02/27/2021   Procedure: LEFT TOTAL KNEE ARTHROPLASTY;  Surgeon: Newt Minion, MD;  Location: [place];  Service: Orthopedics;  Laterality: Left;   TOTAL KNEE ARTHROPLASTY Right 09/06/2021   Procedure: RIGHT TOTAL KNEE ARTHROPLASTY;  Surgeon: Newt Minion, MD;  Location: [place];  Service: Orthopedics;  Laterality: Right;    There were no vitals filed for this visit.   Subjective Assessment - 10/29/21 1034     Subjective Pt arriving today reporting 3/10 pain in his right knee. Pt stating he has been working this morning completing home chores which may have aggrivated it.    Pertinent History OA, Lt TKA DM, HTN    Limitations Standing;Walking    Currently in Pain? Yes    Pain Score 3     Pain Location Knee    Pain Orientation Right    Pain Descriptors / Indicators Aching;Sore    Pain Type Surgical pain    Pain Onset More than a month ago                [facility] PT Assessment - 10/29/21 0001       Assessment   Medical Diagnosis Z96.651 (ICD-10-CM) - History of arthroplasty of right knee    Referring  Provider (PT) Suzan Slick, NP    Onset Date/Surgical Date 09/06/21    Hand Dominance Right      AROM   Right Knee Extension -5    Right Knee Flexion 115                           [facility] Adult PT Treatment/Exercise - 10/29/21 0001       Neuro Re-ed    Neuro Re-ed Details  tandem stance, lateral stepping each direction, stepping over cones.      Knee/Hip Exercises: Stretches   Press photographer 30 seconds;5 reps   runner stretch on incilne board Rt leg posterior     Knee/Hip Exercises: Aerobic   Recumbent Bike partial revolutions x 6 minutes      Knee/Hip Exercises: Machines for Strengthening   Cybex Knee Extension Double leg up, Rt lowering only 3 x 10 10 lbs    Cybex Knee Flexion R LE 25#,    Total Gym Leg Press Rt leg 75 lbs 3 x 10      Knee/Hip Exercises: Standing   Lateral Step Up 15 reps;Hand Hold:  1;Step Height: 6";Right                       PT Short Term Goals - 10/29/21 1042       PT SHORT TERM GOAL #1   Title Independent with initial HEP    Status Achieved      PT SHORT TERM GOAL #2   Title Rt knee AROM improved 0-100 for improved function    Baseline -5 to 115    Status Partially Met               PT Long Term Goals - 10/29/21 1043       PT LONG TERM GOAL #1   Title Independent with final HEP    Status On-going      PT LONG TERM GOAL #2   Title Rt knee AROM improved 0-110 for improved function    Status On-going      PT LONG TERM GOAL #3   Title Amb independently without significant deviations for improved function    Status On-going      PT LONG TERM GOAL #4   Title Report pain < 2/10 for improved function    Status On-going      PT LONG TERM GOAL #5   Title FOTO score improved to 61 for improved function    Status On-going                   Plan - 10/29/21 1037     Clinical Impression Statement Pt tolerating exercises well with no reports of increased pain. Treatment focused on Right LE  strengthening and dynamic balance/gait. Pt using a straight cane at home for balance on community level surfaces and when pain increases. Continue skilled PT to maximize function.    Personal Factors and Comorbidities Comorbidity 3+;Past/Current Experience    Comorbidities OA, Lt TKA DM, HTN    Examination-Activity Limitations Locomotion Level;Transfers;Bend;Sit;Sleep;Squat;Stairs;Stand;Lift    Examination-Participation Restrictions Cleaning;Yard Work;Community Activity;Occupation    Stability/Clinical Decision Making Evolving/Moderate complexity    Rehab Potential Good    PT Frequency 2x / week    PT Duration 8 weeks    PT Treatment/Interventions ADLs/Self Care Home Management;Cryotherapy;Electrical Stimulation;Moist Heat;Ultrasound;DME Instruction;Balance training;Therapeutic exercise;Therapeutic activities;Functional mobility training;Stair training;Gait training;Neuromuscular re-education;Patient/family education;Manual techniques;Passive range of motion;Dry needling;Taping;Vasopneumatic Device    PT Next Visit Plan Progressive strengtheing, dynamic and compliant balance as tolerated    Consulted and Agree with Plan of Care Patient             Patient will benefit from skilled therapeutic intervention in order to improve the following deficits and impairments:  Abnormal gait, Increased fascial restricitons, Pain, Decreased mobility, Decreased range of motion, Decreased endurance, Decreased strength, Impaired flexibility, Difficulty walking, Decreased balance  Visit Diagnosis: Stiffness of right knee, not elsewhere classified  Acute pain of right knee  Muscle weakness (generalized)  Other abnormalities of gait and mobility  Localized edema  Acute pain of left knee  Stiffness of left knee, not elsewhere classified     Problem List Patient Active Problem List   Diagnosis Date Noted   Total knee replacement status, right 09/06/2021   Unilateral primary osteoarthritis, right  knee    Arthritis of left knee 02/27/2021   Unilateral primary osteoarthritis, left knee     Oretha Caprice, PT, MPT 10/29/2021, 10:58 AM  [facility] [address] [place], [place], [address] Phone: [phone]   Fax:  [phone]  Name: Travis Conradt  White MRN: [id number] Date of Birth: 09-Jul-1961

## 2021-10-30 NOTE — Telephone Encounter (Signed)
Pt informed

## 2021-10-31 ENCOUNTER — Other Ambulatory Visit: Payer: Self-pay

## 2021-10-31 ENCOUNTER — Ambulatory Visit (INDEPENDENT_AMBULATORY_CARE_PROVIDER_SITE_OTHER): Payer: BC Managed Care – PPO | Admitting: Physical Therapy

## 2021-10-31 ENCOUNTER — Encounter: Payer: Self-pay | Admitting: Physical Therapy

## 2021-10-31 DIAGNOSIS — M25661 Stiffness of right knee, not elsewhere classified: Secondary | ICD-10-CM | POA: Diagnosis not present

## 2021-10-31 DIAGNOSIS — M6281 Muscle weakness (generalized): Secondary | ICD-10-CM

## 2021-10-31 DIAGNOSIS — R2689 Other abnormalities of gait and mobility: Secondary | ICD-10-CM

## 2021-10-31 DIAGNOSIS — R600 Localized edema: Secondary | ICD-10-CM

## 2021-10-31 DIAGNOSIS — M25561 Pain in right knee: Secondary | ICD-10-CM

## 2021-10-31 NOTE — Therapy (Signed)
[facility] [address] [place], [place], [address] Phone: [phone]   Fax:  [phone]  Physical Therapy Treatment  Patient Details  Name: Travis White MRN: [id number] Date of Birth: February 19, 1961 Referring Provider (PT): Suzan Slick, NP   Encounter Date: 10/31/2021   PT End of Session - 10/31/21 1409     Visit Number 8    Number of Visits 16    Date for PT Re-Evaluation 11/21/21    Authorization Type BCBS    PT Start Time 1331    PT Stop Time 1409    PT Time Calculation (min) 38 min    Activity Tolerance Patient tolerated treatment well    Behavior During Therapy [facility] for tasks assessed/performed             Past Medical History:  Diagnosis Date   Arthritis    Diabetes mellitus    type II   Hypertension     Past Surgical History:  Procedure Laterality Date   Left elbow Left    TOTAL KNEE ARTHROPLASTY Left 02/27/2021   Procedure: LEFT TOTAL KNEE ARTHROPLASTY;  Surgeon: Newt Minion, MD;  Location: [place];  Service: Orthopedics;  Laterality: Left;   TOTAL KNEE ARTHROPLASTY Right 09/06/2021   Procedure: RIGHT TOTAL KNEE ARTHROPLASTY;  Surgeon: Newt Minion, MD;  Location: [place];  Service: Orthopedics;  Laterality: Right;    There were no vitals filed for this visit.   Subjective Assessment - 10/31/21 1334     Subjective doing well; has been up more today    Pertinent History OA, Lt TKA DM, HTN    Limitations Standing;Walking    Patient Stated Goals improve mobility, return to work    Currently in Pain? Yes    Pain Score 2     Pain Location Knee    Pain Orientation Right    Pain Descriptors / Indicators Aching;Sore    Pain Type Surgical pain    Pain Onset More than a month ago    Pain Frequency Intermittent    Aggravating Factors  increased activity/walking on knee    Pain Relieving Factors rest                               [facility] Adult PT Treatment/Exercise - 10/31/21 1337        Neuro Re-ed    Neuro Re-ed Details  tandem stance 2x30 sec bil; tandem walking along counter x 4 laps, intermittent UE support      Knee/Hip Exercises: Stretches   Gastroc Stretch 3 reps;30 seconds   incline board     Knee/Hip Exercises: Aerobic   Recumbent Bike L2 x 8 min      Knee/Hip Exercises: Machines for Strengthening   Cybex Knee Extension RLE only 3 x 10 10 lbs    Cybex Knee Flexion R LE 25#, 3x10    Total Gym Leg Press Rt leg 75 lbs 3 x 10      Knee/Hip Exercises: Standing   Heel Raises Both;3 sets;10 reps                       PT Short Term Goals - 10/29/21 1042       PT SHORT TERM GOAL #1   Title Independent with initial HEP    Status Achieved      PT SHORT TERM GOAL #2   Title Rt knee AROM improved  0-100 for improved function    Baseline -5 to 115    Status Partially Met               PT Long Term Goals - 10/29/21 1043       PT LONG TERM GOAL #1   Title Independent with final HEP    Status On-going      PT LONG TERM GOAL #2   Title Rt knee AROM improved 0-110 for improved function    Status On-going      PT LONG TERM GOAL #3   Title Amb independently without significant deviations for improved function    Status On-going      PT LONG TERM GOAL #4   Title Report pain < 2/10 for improved function    Status On-going      PT LONG TERM GOAL #5   Title FOTO score improved to 61 for improved function    Status On-going                   Plan - 10/31/21 1409     Clinical Impression Statement Pt overall progressing well demonstratig good functional mobility. He is nearing baseline for mobility and balance and anticipate he may be ready for d/c early.  Will continue to benefit from PT to maximize function.    Personal Factors and Comorbidities Comorbidity 3+;Past/Current Experience    Comorbidities OA, Lt TKA DM, HTN    Examination-Activity Limitations Locomotion Level;Transfers;Bend;Sit;Sleep;Squat;Stairs;Stand;Lift     Examination-Participation Restrictions Cleaning;Yard Work;Community Activity;Occupation    Stability/Clinical Decision Making Evolving/Moderate complexity    Rehab Potential Good    PT Frequency 2x / week    PT Duration 8 weeks    PT Treatment/Interventions ADLs/Self Care Home Management;Cryotherapy;Electrical Stimulation;Moist Heat;Ultrasound;DME Instruction;Balance training;Therapeutic exercise;Therapeutic activities;Functional mobility training;Stair training;Gait training;Neuromuscular re-education;Patient/family education;Manual techniques;Passive range of motion;Dry needling;Taping;Vasopneumatic Device    PT Next Visit Plan Progressive strengtheing, dynamic and compliant balance as tolerated, measure ROM    Consulted and Agree with Plan of Care Patient             Patient will benefit from skilled therapeutic intervention in order to improve the following deficits and impairments:  Abnormal gait, Increased fascial restricitons, Pain, Decreased mobility, Decreased range of motion, Decreased endurance, Decreased strength, Impaired flexibility, Difficulty walking, Decreased balance  Visit Diagnosis: Stiffness of right knee, not elsewhere classified  Acute pain of right knee  Muscle weakness (generalized)  Other abnormalities of gait and mobility  Localized edema     Problem List Patient Active Problem List   Diagnosis Date Noted   Total knee replacement status, right 09/06/2021   Unilateral primary osteoarthritis, right knee    Arthritis of left knee 02/27/2021   Unilateral primary osteoarthritis, left knee         Laureen Abrahams, PT, DPT 10/31/21 2:11 PM     [facility] [address] [place], [place], [address] Phone: [phone]   Fax:  [phone]  Name: Travis White MRN: [id number] Date of Birth: 10/25/1961

## 2021-11-07 ENCOUNTER — Encounter: Payer: BC Managed Care – PPO | Admitting: Rehabilitative and Restorative Service Providers"

## 2021-11-13 ENCOUNTER — Encounter: Payer: BC Managed Care – PPO | Admitting: Physical Therapy

## 2021-11-14 ENCOUNTER — Ambulatory Visit: Payer: BC Managed Care – PPO | Admitting: Orthopedic Surgery

## 2021-11-14 DIAGNOSIS — U071 COVID-19: Secondary | ICD-10-CM | POA: Diagnosis not present

## 2021-11-14 DIAGNOSIS — R058 Other specified cough: Secondary | ICD-10-CM | POA: Diagnosis not present

## 2021-11-14 DIAGNOSIS — R0981 Nasal congestion: Secondary | ICD-10-CM | POA: Diagnosis not present

## 2021-11-18 ENCOUNTER — Encounter: Payer: BC Managed Care – PPO | Admitting: Rehabilitative and Restorative Service Providers"

## 2021-11-19 ENCOUNTER — Ambulatory Visit: Payer: BC Managed Care – PPO | Admitting: Family

## 2021-11-20 ENCOUNTER — Other Ambulatory Visit: Payer: Self-pay

## 2021-11-20 ENCOUNTER — Ambulatory Visit (INDEPENDENT_AMBULATORY_CARE_PROVIDER_SITE_OTHER): Payer: BC Managed Care – PPO | Admitting: Rehabilitative and Restorative Service Providers"

## 2021-11-20 ENCOUNTER — Encounter: Payer: Self-pay | Admitting: Rehabilitative and Restorative Service Providers"

## 2021-11-20 DIAGNOSIS — R2689 Other abnormalities of gait and mobility: Secondary | ICD-10-CM

## 2021-11-20 DIAGNOSIS — M6281 Muscle weakness (generalized): Secondary | ICD-10-CM

## 2021-11-20 DIAGNOSIS — M25661 Stiffness of right knee, not elsewhere classified: Secondary | ICD-10-CM | POA: Diagnosis not present

## 2021-11-20 DIAGNOSIS — R600 Localized edema: Secondary | ICD-10-CM

## 2021-11-20 DIAGNOSIS — M25561 Pain in right knee: Secondary | ICD-10-CM | POA: Diagnosis not present

## 2021-11-20 NOTE — Therapy (Signed)
[place] [facility] °[address] °[place], [place], [address] °Phone: [phone]   Fax:  [phone] ° °Physical Therapy Treatment /Discharge  ° °Patient Details  °Name: Travis White °MRN: [id number] °Date of Birth: 02/28/1961 °Referring Provider (PT): Zamora, Erin R, NP ° ° °Encounter Date: 11/20/2021 ° °PHYSICAL THERAPY DISCHARGE SUMMARY ° °Visits from Start of Care: 9 ° °Current functional level related to goals / functional outcomes: °See note °  °Remaining deficits: °See note °  °Education / Equipment: °HEP  ° °Patient agrees to discharge. Patient goals were met. Patient is being discharged due to being pleased with the current functional level. ° ° ° PT End of Session - 11/20/21 0930   ° ° Visit Number 9   ° Number of Visits 16   ° Date for PT Re-Evaluation 11/21/21   ° Authorization Type BCBS   ° PT Start Time 0927   ° PT Stop Time 0952   ° PT Time Calculation (min) 25 min   ° Activity Tolerance Patient tolerated treatment well   ° Behavior During Therapy WFL for tasks assessed/performed   ° °  °  ° °  ° ° °Past Medical History:  °Diagnosis Date  ° Arthritis   ° Diabetes mellitus   ° type II  ° Hypertension   ° ° °Past Surgical History:  °Procedure Laterality Date  ° Left elbow Left   ° TOTAL KNEE ARTHROPLASTY Left 02/27/2021  ° Procedure: LEFT TOTAL KNEE ARTHROPLASTY;  Surgeon: Duda, Marcus V, MD;  Location: [facility];  Service: Orthopedics;  Laterality: Left;  ° TOTAL KNEE ARTHROPLASTY Right 09/06/2021  ° Procedure: RIGHT TOTAL KNEE ARTHROPLASTY;  Surgeon: Duda, Marcus V, MD;  Location: [facility];  Service: Orthopedics;  Laterality: Right;  ° ° °There were no vitals filed for this visit. ° ° Subjective Assessment - 11/20/21 0929   ° ° Subjective Pt. indicated pain at worst in last week about a 3/10.  Pt. indicated overall improvement around 90 % today.   ° Pertinent History OA, Lt TKA DM, HTN   ° Limitations Standing;Walking   ° Patient Stated Goals improve mobility, return to work   °  Currently in Pain? Yes   ° Pain Score 3    at worst  ° Pain Location Knee   ° Pain Orientation Right   ° Pain Descriptors / Indicators Aching;Sore;Tightness   ° Pain Type Surgical pain   ° Pain Onset More than a month ago   ° Pain Frequency Intermittent   ° Aggravating Factors  end range bending, increased activity at times   ° Pain Relieving Factors rest   ° °  °  ° °  ° ° ° ° ° [facility] PT Assessment - 11/20/21 0001   ° °  ° Assessment  ° Medical Diagnosis Z96.651 (ICD-10-CM) - History of arthroplasty of right knee   ° Referring Provider (PT) Zamora, Erin R, NP   ° Onset Date/Surgical Date 09/06/21   ° Hand Dominance Right   °  ° Observation/Other Assessments  ° Focus on Therapeutic Outcomes (FOTO)  updated 83%   °  ° Functional Tests  ° Functional tests Single leg stance   °  ° Single Leg Stance  ° Comments Rt SLS 10 seconds   °  ° AROM  ° Right Knee Extension -2   ° Right Knee Flexion 115   °  ° Strength  ° Strength Assessment Site Knee   ° Right/Left Knee Right   °   Right Knee Flexion 5/5   ° Right Knee Extension 5/5   ° °  °  ° °  ° ° ° ° ° ° ° ° ° ° ° ° ° ° ° ° [facility] Adult PT Treatment/Exercise - 11/20/21 0001   ° °  ° Knee/Hip Exercises: Aerobic  ° Recumbent Bike Lvl 1 7 mins   °  ° Knee/Hip Exercises: Machines for Strengthening  ° Cybex Knee Extension RLE only 3 x 10 10 lbs   ° Cybex Knee Flexion RLE 25#, 3x10   ° Total Gym Leg Press Rt leg 75 lbs 3 x 10   ° °  °  ° °  ° ° ° ° ° ° ° ° ° ° PT Education - 11/20/21 0958   ° ° Education Details D/C review   ° Person(s) Educated Patient   ° Methods Explanation   ° Comprehension Verbalized understanding   ° °  °  ° °  ° ° ° PT Short Term Goals - 10/29/21 1042   ° °  ° PT SHORT TERM GOAL #1  ° Title Independent with initial HEP   ° Status Achieved   °  ° PT SHORT TERM GOAL #2  ° Title Rt knee AROM improved 0-100 for improved function   ° Baseline -5 to 115   ° Status Partially Met   ° °  °  ° °  ° ° ° ° PT Long Term Goals - 11/20/21 0957   ° °  ° PT LONG TERM GOAL #1  °  Title Independent with final HEP   ° Status Achieved   °  ° PT LONG TERM GOAL #2  ° Title Rt knee AROM improved 0-110 for improved function   ° Status Partially Met   °  ° PT LONG TERM GOAL #3  ° Title Amb independently without significant deviations for improved function   ° Status Achieved   °  ° PT LONG TERM GOAL #4  ° Title Report pain < 2/10 for improved function   ° Status Partially Met   °  ° PT LONG TERM GOAL #5  ° Title FOTO score improved to 61 for improved function   ° Status Achieved   ° °  °  ° °  ° ° ° ° ° ° ° ° Plan - 11/20/21 0949   ° ° Clinical Impression Statement Pt. has attended 9 visits overall during course of treatment.  Pt. indicated good subjective and functional improvement as evident in pain reporting as well as FOTO information update.  See objective measurements showing gains to reach goals as well.  Recommend d/c to HEP at this time.  Pt. in agreement.   ° Personal Factors and Comorbidities Comorbidity 3+;Past/Current Experience   ° Comorbidities OA, Lt TKA DM, HTN   ° Examination-Activity Limitations Locomotion Level;Transfers;Bend;Sit;Sleep;Squat;Stairs;Stand;Lift   ° Examination-Participation Restrictions Cleaning;Yard Work;Community Activity;Occupation   ° Stability/Clinical Decision Making Evolving/Moderate complexity   ° Rehab Potential Good   ° PT Treatment/Interventions ADLs/Self Care Home Management;Cryotherapy;Electrical Stimulation;Moist Heat;Ultrasound;DME Instruction;Balance training;Therapeutic exercise;Therapeutic activities;Functional mobility training;Stair training;Gait training;Neuromuscular re-education;Patient/family education;Manual techniques;Passive range of motion;Dry needling;Taping;Vasopneumatic Device   ° PT Next Visit Plan D/C to HEP   ° PT Home Exercise Plan Access Code: [email or web address]   ° Consulted and Agree with Plan of Care Patient   ° °  °  ° °  ° ° °Patient will benefit from skilled therapeutic intervention in order to improve the following deficits and  impairments:    Abnormal gait, Increased fascial restricitons, Pain, Decreased mobility, Decreased range of motion, Decreased endurance, Decreased strength, Impaired flexibility, Difficulty walking, Decreased balance ° °Visit Diagnosis: °Stiffness of right knee, not elsewhere classified ° °Acute pain of right knee ° °Muscle weakness (generalized) ° °Other abnormalities of gait and mobility ° °Localized edema ° ° ° ° °Problem List °Patient Active Problem List  ° Diagnosis Date Noted  ° Total knee replacement status, right 09/06/2021  ° Unilateral primary osteoarthritis, right knee   ° Arthritis of left knee 02/27/2021  ° Unilateral primary osteoarthritis, left knee   ° °Michael Wright, PT, DPT, OCS, ATC °11/20/21  10:00 AM ° ° ° °[facility] °[facility] °[address] °[place], [place], [address] °Phone: [phone]   Fax:  [phone] ° °Name: Giavanni G Guardado °MRN: [id number] °Date of Birth: 06/30/1961 ° ° ° °

## 2021-11-22 ENCOUNTER — Ambulatory Visit (INDEPENDENT_AMBULATORY_CARE_PROVIDER_SITE_OTHER): Payer: BC Managed Care – PPO | Admitting: Family

## 2021-11-22 ENCOUNTER — Encounter: Payer: Self-pay | Admitting: Family

## 2021-11-22 DIAGNOSIS — Z96651 Presence of right artificial knee joint: Secondary | ICD-10-CM

## 2021-11-22 NOTE — Progress Notes (Signed)
° °  Post-Op Visit Note   Patient: Travis White           Date of Birth: 11/28/1960           MRN: [id number] Visit Date: 11/22/2021 PCP: Garlan Fillers, MD  Chief Complaint:  Chief Complaint  Patient presents with   Right Knee - Routine Post Op    09/06/2021 right total knee replacement     HPI:  HPI The patient is a 61 year old gentleman seen today status post right total knee arthroplasty October 28.  He has been doing quite well.  He has completed his physical therapy. Ortho Exam On examination of the right knee and his incision is well-healed there is no edema he does full extension and flexion to 115  Visit Diagnoses: No diagnosis found.  Plan: Activities as tolerated.  We will follow-up as needed.  Provided a return to work note today.  He will return without restrictions on January 30.  Follow-Up Instructions: Return if symptoms worsen or fail to improve.   Imaging: No results found.  Orders:  No orders of the defined types were placed in this encounter.  No orders of the defined types were placed in this encounter.    PMFS History: Patient Active Problem List   Diagnosis Date Noted   Total knee replacement status, right 09/06/2021   Unilateral primary osteoarthritis, right knee    Arthritis of left knee 02/27/2021   Unilateral primary osteoarthritis, left knee    Past Medical History:  Diagnosis Date   Arthritis    Diabetes mellitus    type II   Hypertension     History reviewed. No pertinent family history.  Past Surgical History:  Procedure Laterality Date   Left elbow Left    TOTAL KNEE ARTHROPLASTY Left 02/27/2021   Procedure: LEFT TOTAL KNEE ARTHROPLASTY;  Surgeon: Nadara Mustard, MD;  Location: [facility];  Service: Orthopedics;  Laterality: Left;   TOTAL KNEE ARTHROPLASTY Right 09/06/2021   Procedure: RIGHT TOTAL KNEE ARTHROPLASTY;  Surgeon: Nadara Mustard, MD;  [facility];  Service: Orthopedics;  Laterality: Right;   Social History    Occupational History   Not on file  Tobacco Use   Smoking status: Every Day    Packs/day: 1.00    Types: Cigarettes   Smokeless tobacco: Never  Vaping Use   Vaping Use: Never used  Substance and Sexual Activity   Alcohol use: No   Drug use: No   Sexual activity: Not on file

## 2021-11-28 ENCOUNTER — Telehealth: Payer: Self-pay | Admitting: Orthopedic Surgery

## 2021-11-28 NOTE — Telephone Encounter (Signed)
[employer] forms received. To [employer]. 

## 2021-12-04 DIAGNOSIS — H04123 Dry eye syndrome of bilateral lacrimal glands: Secondary | ICD-10-CM | POA: Diagnosis not present

## 2021-12-04 DIAGNOSIS — H35013 Changes in retinal vascular appearance, bilateral: Secondary | ICD-10-CM | POA: Diagnosis not present

## 2021-12-04 DIAGNOSIS — H25813 Combined forms of age-related cataract, bilateral: Secondary | ICD-10-CM | POA: Diagnosis not present

## 2021-12-04 DIAGNOSIS — E119 Type 2 diabetes mellitus without complications: Secondary | ICD-10-CM | POA: Diagnosis not present

## 2021-12-06 IMAGING — US US ABDOMEN LIMITED
1 series · 14 of 25 positions shown · non-contrast
Comparison: None.

CLINICAL DATA: 60-year-old male with right upper quadrant abdominal
pain.

EXAM:
ULTRASOUND ABDOMEN LIMITED RIGHT UPPER QUADRANT

[Series 1: us abdomen limited · 14 of 72 slices shown]
[im 1/72]
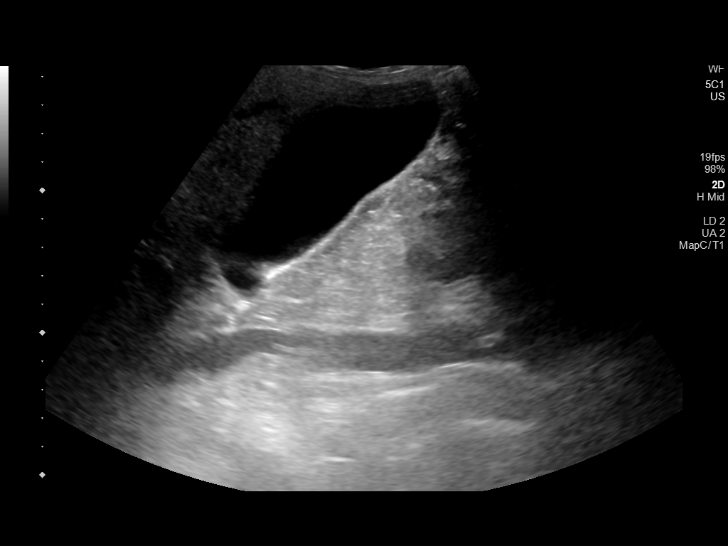
[im 6/72]
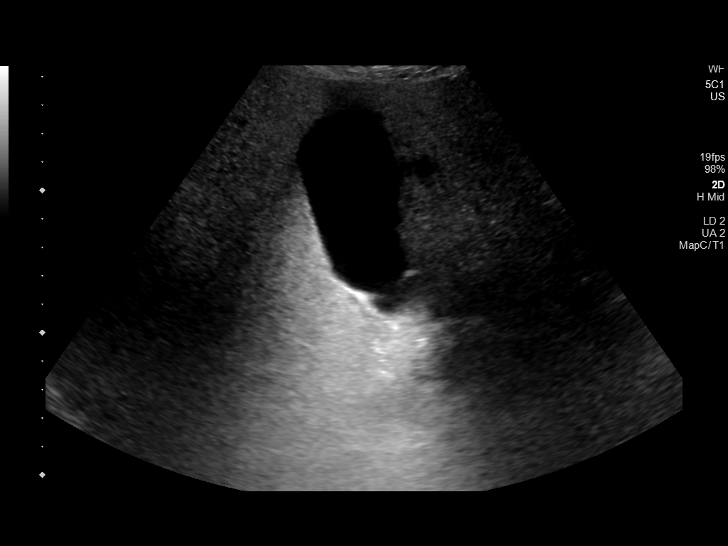
[im 12/72]
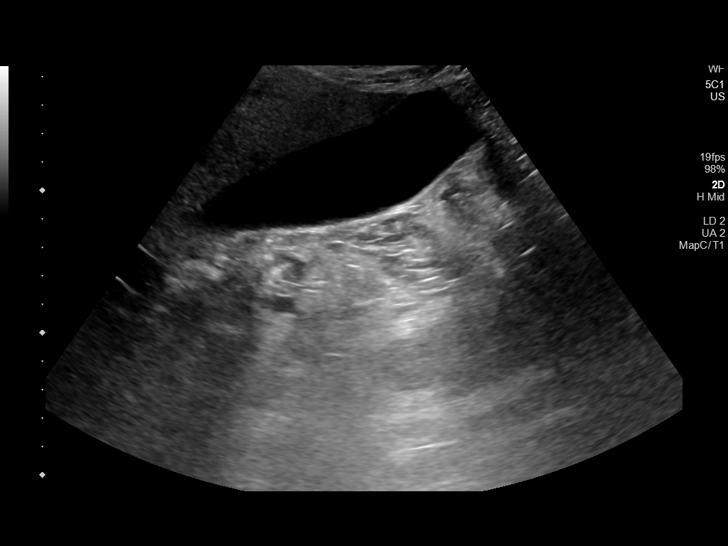
[im 18/72]
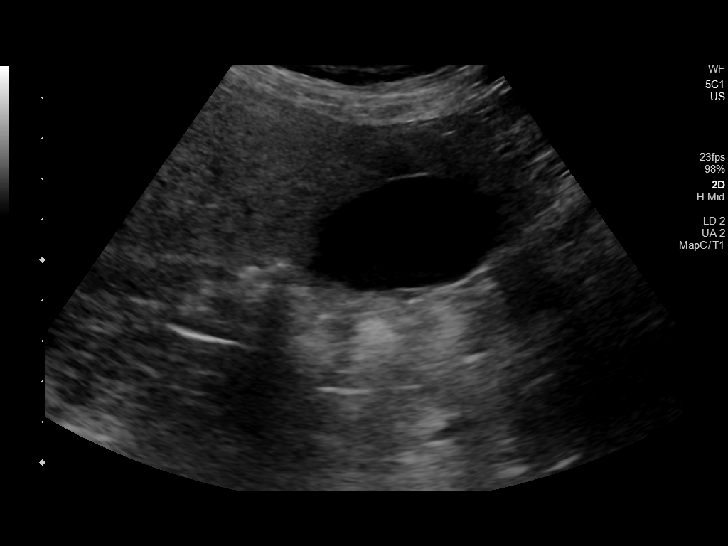
[im 24/72]
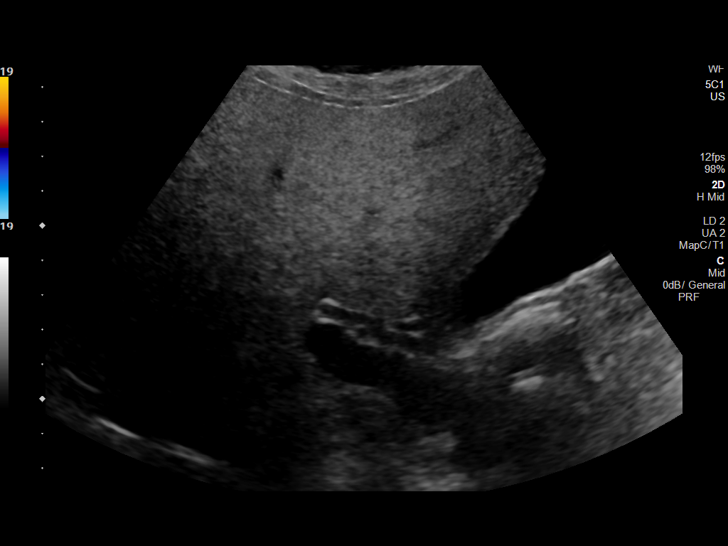
[im 27/72]
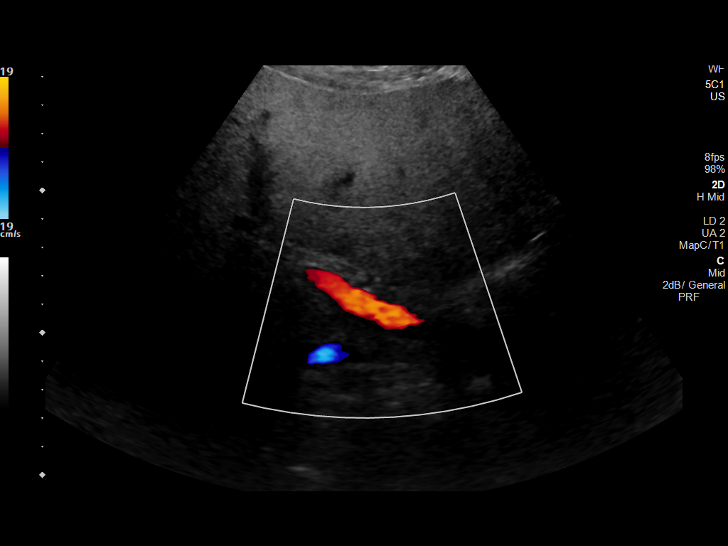
[im 33/72]
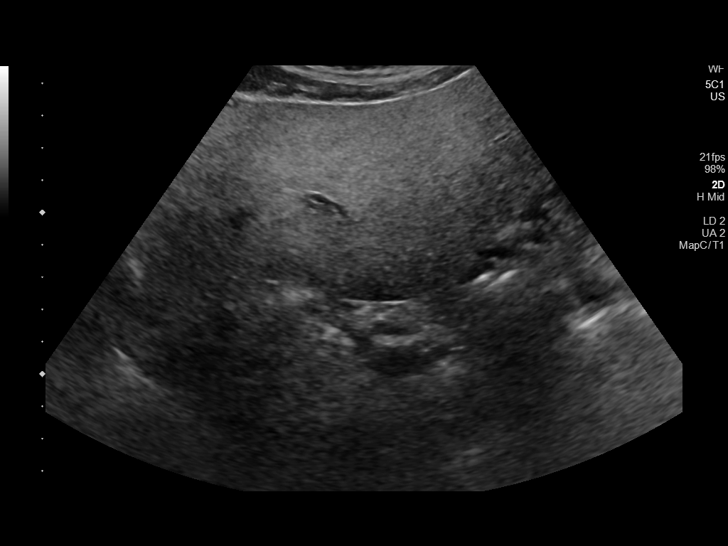
[im 39/72]
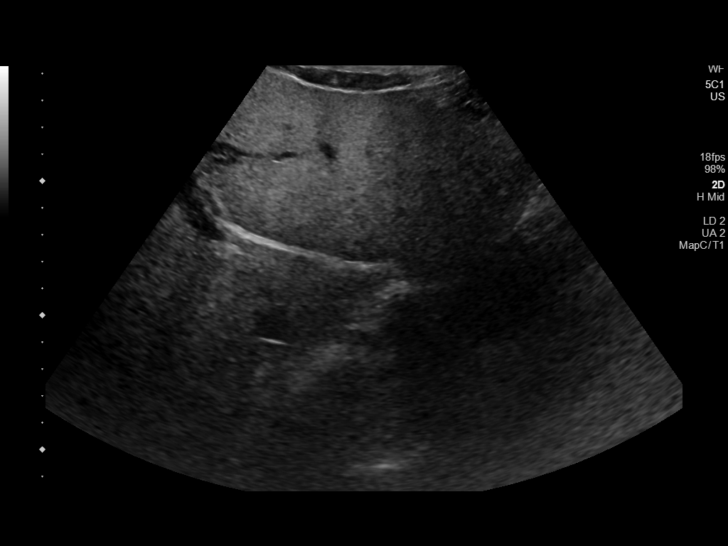
[im 45/72]
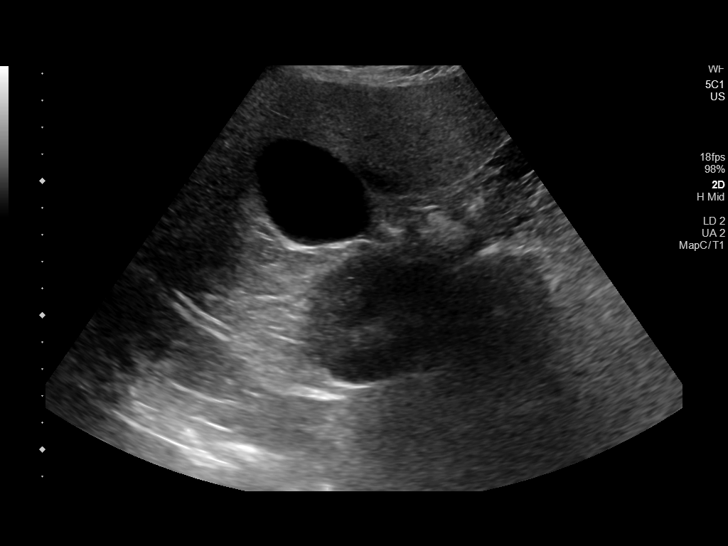
[im 48/72]
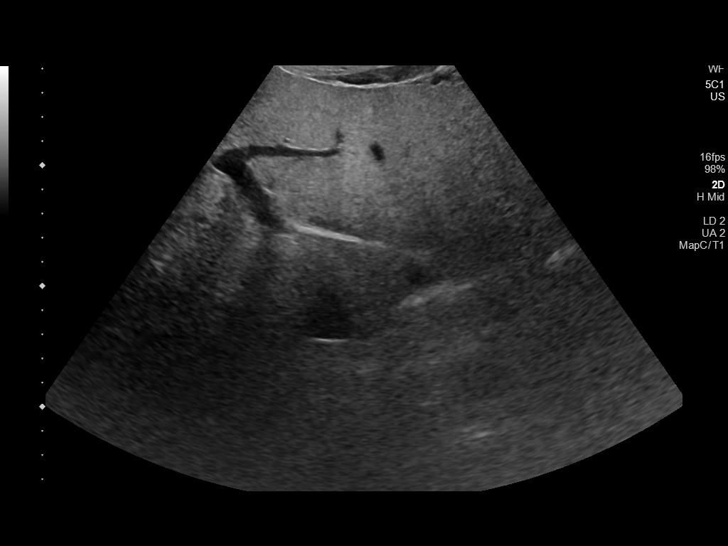
[im 54/72]
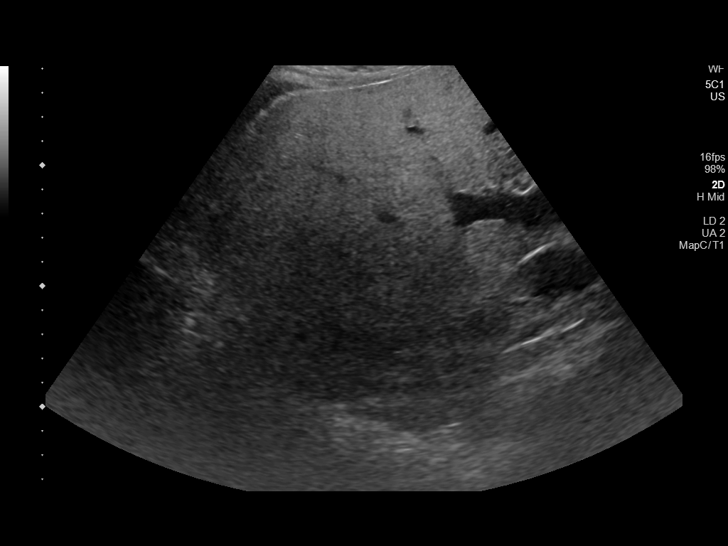
[im 60/72]
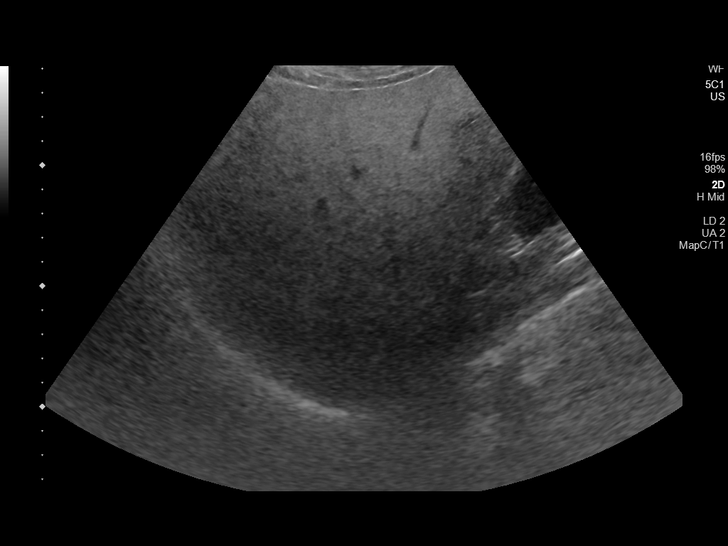
[im 66/72]
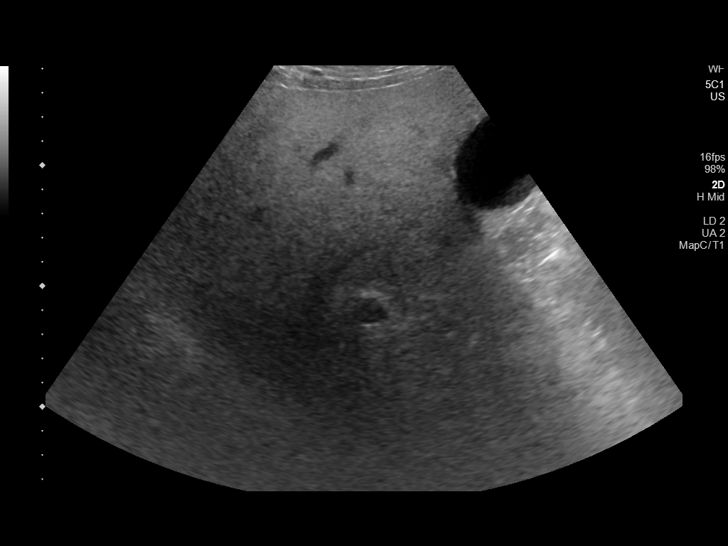
[im 72/72]
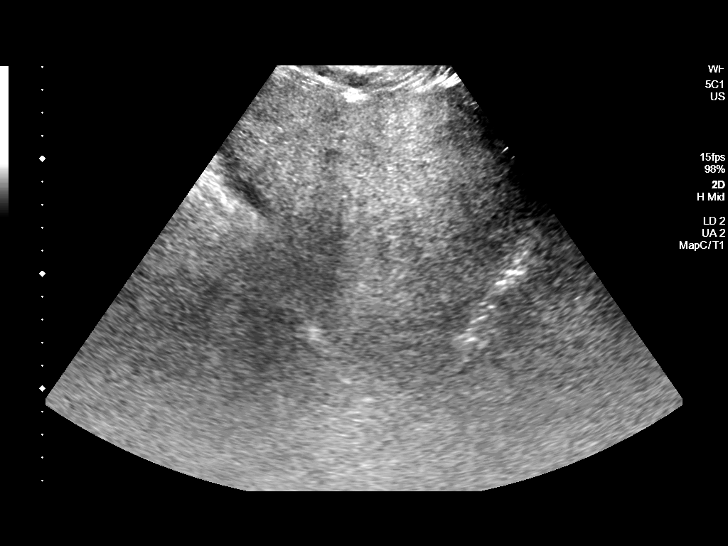

[14 of 25 positions shown; findings below may reference images not displayed]

FINDINGS: Gallbladder:

No gallstones or wall thickening visualized. No sonographic Murphy
sign noted by sonographer.

Common bile duct:

Diameter: 3 mm

Liver:

There is diffuse increased liver echogenicity most commonly seen in
the setting of fatty infiltration. Superimposed inflammation or
fibrosis is not excluded. Clinical correlation is recommended.
Portal vein is patent on color Doppler imaging with normal direction
of blood flow towards the liver.

Other: None.
IMPRESSION: Fatty liver, otherwise unremarkable right upper quadrant ultrasound.

## 2022-01-13 DIAGNOSIS — K529 Noninfective gastroenteritis and colitis, unspecified: Secondary | ICD-10-CM | POA: Diagnosis not present

## 2022-01-13 DIAGNOSIS — E1151 Type 2 diabetes mellitus with diabetic peripheral angiopathy without gangrene: Secondary | ICD-10-CM | POA: Diagnosis not present

## 2022-03-03 DIAGNOSIS — R197 Diarrhea, unspecified: Secondary | ICD-10-CM | POA: Diagnosis not present

## 2022-03-27 DIAGNOSIS — K5792 Diverticulitis of intestine, part unspecified, without perforation or abscess without bleeding: Secondary | ICD-10-CM | POA: Diagnosis not present

## 2022-03-27 DIAGNOSIS — I10 Essential (primary) hypertension: Secondary | ICD-10-CM | POA: Diagnosis not present

## 2022-04-23 DIAGNOSIS — I10 Essential (primary) hypertension: Secondary | ICD-10-CM | POA: Diagnosis not present

## 2022-04-23 DIAGNOSIS — E1151 Type 2 diabetes mellitus with diabetic peripheral angiopathy without gangrene: Secondary | ICD-10-CM | POA: Diagnosis not present

## 2022-04-23 DIAGNOSIS — R55 Syncope and collapse: Secondary | ICD-10-CM | POA: Diagnosis not present

## 2022-04-23 DIAGNOSIS — H53139 Sudden visual loss, unspecified eye: Secondary | ICD-10-CM | POA: Diagnosis not present

## 2022-04-25 ENCOUNTER — Other Ambulatory Visit: Payer: Self-pay | Admitting: Registered Nurse

## 2022-04-25 DIAGNOSIS — H53139 Sudden visual loss, unspecified eye: Secondary | ICD-10-CM

## 2022-05-08 DIAGNOSIS — L0292 Furuncle, unspecified: Secondary | ICD-10-CM | POA: Diagnosis not present

## 2022-05-10 ENCOUNTER — Ambulatory Visit
Admission: RE | Admit: 2022-05-10 | Discharge: 2022-05-10 | Disposition: A | Discharge status: Home / Self Care | Payer: BC Managed Care – PPO | Source: Ambulatory Visit | Attending: Registered Nurse | Admitting: Registered Nurse

## 2022-05-10 DIAGNOSIS — R410 Disorientation, unspecified: Secondary | ICD-10-CM | POA: Diagnosis not present

## 2022-05-10 DIAGNOSIS — R42 Dizziness and giddiness: Secondary | ICD-10-CM | POA: Diagnosis not present

## 2022-05-10 DIAGNOSIS — H53139 Sudden visual loss, unspecified eye: Secondary | ICD-10-CM

## 2022-05-10 DIAGNOSIS — H532 Diplopia: Secondary | ICD-10-CM | POA: Diagnosis not present

## 2022-05-10 MED ORDER — GADOBENATE DIMEGLUMINE 529 MG/ML IV SOLN
18.0000 mL | Freq: Once | INTRAVENOUS | Status: AC | PRN
  Administered 2022-05-10: 18 mL via INTRAVENOUS

## 2022-06-03 DIAGNOSIS — E1151 Type 2 diabetes mellitus with diabetic peripheral angiopathy without gangrene: Secondary | ICD-10-CM | POA: Diagnosis not present

## 2022-06-03 DIAGNOSIS — R197 Diarrhea, unspecified: Secondary | ICD-10-CM | POA: Diagnosis not present

## 2022-06-17 ENCOUNTER — Telehealth: Payer: Self-pay | Admitting: Orthopedic Surgery

## 2022-06-17 NOTE — Telephone Encounter (Signed)
error 

## 2022-08-19 DIAGNOSIS — E785 Hyperlipidemia, unspecified: Secondary | ICD-10-CM | POA: Diagnosis not present

## 2022-08-19 DIAGNOSIS — M109 Gout, unspecified: Secondary | ICD-10-CM | POA: Diagnosis not present

## 2022-08-19 DIAGNOSIS — Z125 Encounter for screening for malignant neoplasm of prostate: Secondary | ICD-10-CM | POA: Diagnosis not present

## 2022-08-19 DIAGNOSIS — E1151 Type 2 diabetes mellitus with diabetic peripheral angiopathy without gangrene: Secondary | ICD-10-CM | POA: Diagnosis not present

## 2022-08-26 DIAGNOSIS — E1151 Type 2 diabetes mellitus with diabetic peripheral angiopathy without gangrene: Secondary | ICD-10-CM | POA: Diagnosis not present

## 2022-08-26 DIAGNOSIS — I10 Essential (primary) hypertension: Secondary | ICD-10-CM | POA: Diagnosis not present

## 2022-08-26 DIAGNOSIS — Z1331 Encounter for screening for depression: Secondary | ICD-10-CM | POA: Diagnosis not present

## 2022-08-26 DIAGNOSIS — Z23 Encounter for immunization: Secondary | ICD-10-CM | POA: Diagnosis not present

## 2022-08-26 DIAGNOSIS — R82998 Other abnormal findings in urine: Secondary | ICD-10-CM | POA: Diagnosis not present

## 2022-08-26 DIAGNOSIS — Z0000 Encounter for general adult medical examination without abnormal findings: Secondary | ICD-10-CM | POA: Diagnosis not present

## 2022-08-26 DIAGNOSIS — Z1339 Encounter for screening examination for other mental health and behavioral disorders: Secondary | ICD-10-CM | POA: Diagnosis not present

## 2022-09-10 DIAGNOSIS — R197 Diarrhea, unspecified: Secondary | ICD-10-CM | POA: Diagnosis not present

## 2022-12-04 DIAGNOSIS — H04123 Dry eye syndrome of bilateral lacrimal glands: Secondary | ICD-10-CM | POA: Diagnosis not present

## 2022-12-04 DIAGNOSIS — H25813 Combined forms of age-related cataract, bilateral: Secondary | ICD-10-CM | POA: Diagnosis not present

## 2022-12-04 DIAGNOSIS — H524 Presbyopia: Secondary | ICD-10-CM | POA: Diagnosis not present

## 2022-12-04 DIAGNOSIS — E119 Type 2 diabetes mellitus without complications: Secondary | ICD-10-CM | POA: Diagnosis not present

## 2022-12-04 DIAGNOSIS — H179 Unspecified corneal scar and opacity: Secondary | ICD-10-CM | POA: Diagnosis not present

## 2022-12-29 DIAGNOSIS — E1151 Type 2 diabetes mellitus with diabetic peripheral angiopathy without gangrene: Secondary | ICD-10-CM | POA: Diagnosis not present

## 2022-12-29 DIAGNOSIS — I10 Essential (primary) hypertension: Secondary | ICD-10-CM | POA: Diagnosis not present

## 2022-12-29 DIAGNOSIS — I739 Peripheral vascular disease, unspecified: Secondary | ICD-10-CM | POA: Diagnosis not present

## 2023-09-28 DIAGNOSIS — I10 Essential (primary) hypertension: Secondary | ICD-10-CM | POA: Diagnosis not present

## 2023-09-28 DIAGNOSIS — E785 Hyperlipidemia, unspecified: Secondary | ICD-10-CM | POA: Diagnosis not present

## 2023-09-28 DIAGNOSIS — M109 Gout, unspecified: Secondary | ICD-10-CM | POA: Diagnosis not present

## 2023-09-28 DIAGNOSIS — E1151 Type 2 diabetes mellitus with diabetic peripheral angiopathy without gangrene: Secondary | ICD-10-CM | POA: Diagnosis not present

## 2023-11-13 DIAGNOSIS — Z720 Tobacco use: Secondary | ICD-10-CM | POA: Diagnosis not present

## 2023-11-13 DIAGNOSIS — J40 Bronchitis, not specified as acute or chronic: Secondary | ICD-10-CM | POA: Diagnosis not present

## 2023-11-13 DIAGNOSIS — H66001 Acute suppurative otitis media without spontaneous rupture of ear drum, right ear: Secondary | ICD-10-CM | POA: Diagnosis not present

## 2023-11-13 DIAGNOSIS — R0602 Shortness of breath: Secondary | ICD-10-CM | POA: Diagnosis not present

## 2023-11-30 DIAGNOSIS — Z0000 Encounter for general adult medical examination without abnormal findings: Secondary | ICD-10-CM | POA: Diagnosis not present

## 2023-11-30 DIAGNOSIS — Z23 Encounter for immunization: Secondary | ICD-10-CM | POA: Diagnosis not present

## 2023-11-30 DIAGNOSIS — E1151 Type 2 diabetes mellitus with diabetic peripheral angiopathy without gangrene: Secondary | ICD-10-CM | POA: Diagnosis not present

## 2023-12-07 DIAGNOSIS — H25813 Combined forms of age-related cataract, bilateral: Secondary | ICD-10-CM | POA: Diagnosis not present

## 2023-12-07 DIAGNOSIS — H35033 Hypertensive retinopathy, bilateral: Secondary | ICD-10-CM | POA: Diagnosis not present

## 2023-12-07 DIAGNOSIS — E119 Type 2 diabetes mellitus without complications: Secondary | ICD-10-CM | POA: Diagnosis not present

## 2023-12-07 DIAGNOSIS — H04123 Dry eye syndrome of bilateral lacrimal glands: Secondary | ICD-10-CM | POA: Diagnosis not present

## 2023-12-07 DIAGNOSIS — H524 Presbyopia: Secondary | ICD-10-CM | POA: Diagnosis not present

## 2024-01-03 DIAGNOSIS — L03011 Cellulitis of right finger: Secondary | ICD-10-CM | POA: Diagnosis not present

## 2024-04-12 DIAGNOSIS — I10 Essential (primary) hypertension: Secondary | ICD-10-CM | POA: Diagnosis not present

## 2024-04-12 DIAGNOSIS — E1151 Type 2 diabetes mellitus with diabetic peripheral angiopathy without gangrene: Secondary | ICD-10-CM | POA: Diagnosis not present

## 2024-06-07 DIAGNOSIS — E1151 Type 2 diabetes mellitus with diabetic peripheral angiopathy without gangrene: Secondary | ICD-10-CM | POA: Diagnosis not present

## 2024-06-07 DIAGNOSIS — I10 Essential (primary) hypertension: Secondary | ICD-10-CM | POA: Diagnosis not present

## 2024-06-16 DIAGNOSIS — Z125 Encounter for screening for malignant neoplasm of prostate: Secondary | ICD-10-CM | POA: Diagnosis not present

## 2024-06-16 DIAGNOSIS — R311 Benign essential microscopic hematuria: Secondary | ICD-10-CM | POA: Diagnosis not present

## 2024-06-16 DIAGNOSIS — N486 Induration penis plastica: Secondary | ICD-10-CM | POA: Diagnosis not present

## 2024-06-27 DIAGNOSIS — L720 Epidermal cyst: Secondary | ICD-10-CM | POA: Diagnosis not present

## 2024-07-05 DIAGNOSIS — N486 Induration penis plastica: Secondary | ICD-10-CM | POA: Diagnosis not present

## 2024-07-29 ENCOUNTER — Other Ambulatory Visit: Payer: Self-pay | Admitting: Internal Medicine

## 2024-07-29 DIAGNOSIS — Z720 Tobacco use: Secondary | ICD-10-CM

## 2024-08-17 ENCOUNTER — Ambulatory Visit
Admission: RE | Admit: 2024-08-17 | Discharge: 2024-08-17 | Disposition: A | Discharge status: Home / Self Care | Source: Ambulatory Visit | Attending: Internal Medicine | Admitting: Internal Medicine

## 2024-08-17 DIAGNOSIS — F17210 Nicotine dependence, cigarettes, uncomplicated: Secondary | ICD-10-CM | POA: Diagnosis not present

## 2024-08-17 DIAGNOSIS — Z720 Tobacco use: Secondary | ICD-10-CM

## 2024-08-19 DIAGNOSIS — Z23 Encounter for immunization: Secondary | ICD-10-CM | POA: Diagnosis not present

## 2024-10-19 DIAGNOSIS — M25521 Pain in right elbow: Secondary | ICD-10-CM | POA: Diagnosis not present
